# Patient Record
Sex: Female | Born: 1975 | Race: Black or African American | Hispanic: No | Marital: Married | Smoking: Former smoker
Health system: Southern US, Community
[De-identification: ages and names within clinical notes are randomized; demographics above are authoritative.]

## PROBLEM LIST (undated history)

## (undated) ENCOUNTER — Inpatient Hospital Stay: Payer: Self-pay

## (undated) DIAGNOSIS — O139 Gestational [pregnancy-induced] hypertension without significant proteinuria, unspecified trimester: Secondary | ICD-10-CM

## (undated) DIAGNOSIS — I10 Essential (primary) hypertension: Secondary | ICD-10-CM

## (undated) DIAGNOSIS — F1290 Cannabis use, unspecified, uncomplicated: Secondary | ICD-10-CM

## (undated) DIAGNOSIS — Z8759 Personal history of other complications of pregnancy, childbirth and the puerperium: Secondary | ICD-10-CM

## (undated) DIAGNOSIS — O09529 Supervision of elderly multigravida, unspecified trimester: Secondary | ICD-10-CM

## (undated) DIAGNOSIS — O24419 Gestational diabetes mellitus in pregnancy, unspecified control: Secondary | ICD-10-CM

## (undated) DIAGNOSIS — F10921 Alcohol use, unspecified with intoxication delirium: Secondary | ICD-10-CM

## (undated) DIAGNOSIS — Z8659 Personal history of other mental and behavioral disorders: Secondary | ICD-10-CM

## (undated) HISTORY — PX: OTHER SURGICAL HISTORY: SHX169

## (undated) HISTORY — DX: Gestational diabetes mellitus in pregnancy, unspecified control: O24.419

---
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
## 2009-02-06 ENCOUNTER — Emergency Department: Payer: Self-pay | Admitting: Emergency Medicine

## 2009-02-10 ENCOUNTER — Emergency Department: Payer: Self-pay | Admitting: Internal Medicine

## 2009-02-18 ENCOUNTER — Emergency Department: Payer: Self-pay | Admitting: Emergency Medicine

## 2009-04-24 ENCOUNTER — Emergency Department: Payer: Self-pay | Admitting: Emergency Medicine

## 2010-10-04 ENCOUNTER — Encounter: Payer: Self-pay | Admitting: Obstetrics and Gynecology

## 2010-10-25 ENCOUNTER — Encounter: Payer: Self-pay | Admitting: Maternal & Fetal Medicine

## 2010-11-08 ENCOUNTER — Encounter: Payer: Self-pay | Admitting: Maternal & Fetal Medicine

## 2010-11-22 ENCOUNTER — Encounter: Payer: Self-pay | Admitting: Obstetrics and Gynecology

## 2010-12-27 ENCOUNTER — Encounter: Payer: Self-pay | Admitting: Obstetrics and Gynecology

## 2011-01-22 ENCOUNTER — Encounter: Payer: Self-pay | Admitting: Pediatrics

## 2011-03-07 ENCOUNTER — Encounter: Payer: Self-pay | Admitting: Maternal & Fetal Medicine

## 2011-04-01 ENCOUNTER — Encounter: Payer: Self-pay | Admitting: Obstetrics and Gynecology

## 2011-04-29 ENCOUNTER — Encounter: Payer: Self-pay | Admitting: Obstetrics & Gynecology

## 2011-04-30 ENCOUNTER — Observation Stay: Payer: Self-pay

## 2011-05-01 ENCOUNTER — Observation Stay: Payer: Self-pay | Admitting: Obstetrics and Gynecology

## 2011-05-10 ENCOUNTER — Observation Stay: Payer: Self-pay | Admitting: Obstetrics and Gynecology

## 2011-05-21 ENCOUNTER — Observation Stay: Payer: Self-pay

## 2011-05-22 LAB — PIH PROFILE
Calcium, Total: 8.6 mg/dL (ref 8.5–10.1)
Chloride: 107 mmol/L (ref 98–107)
Co2: 21 mmol/L (ref 21–32)
EGFR (African American): >60
HCT: 32.4 % — ABNORMAL LOW (ref 35.0–47.0)
HGB: 10.7 g/dL — ABNORMAL LOW (ref 12.0–16.0)
MCH: 29.8 pg (ref 26.0–34.0)
MCHC: 32.9 g/dL (ref 32.0–36.0)
MCV: 91 fL (ref 80–100)
Osmolality: 281 (ref 275–301)
RBC: 3.58 10*6/uL — ABNORMAL LOW (ref 3.80–5.20)
RDW: 14.8 % — ABNORMAL HIGH (ref 11.5–14.5)
SGOT(AST): 17 U/L (ref 15–37)
Sodium: 142 mmol/L (ref 136–145)
Uric Acid: 3.7 mg/dL (ref 2.6–6.0)
WBC: 15.2 10*3/uL — ABNORMAL HIGH (ref 3.6–11.0)

## 2011-05-25 ENCOUNTER — Inpatient Hospital Stay: Payer: Self-pay | Admitting: Specialist

## 2011-05-25 LAB — CBC WITH DIFFERENTIAL/PLATELET
Basophil %: 0.4 %
Eosinophil #: 0.3 10*3/uL (ref 0.0–0.7)
Eosinophil %: 2 %
HCT: 34.6 % — ABNORMAL LOW (ref 35.0–47.0)
HGB: 11.5 g/dL — ABNORMAL LOW (ref 12.0–16.0)
Lymphocyte #: 2.6 10*3/uL (ref 1.0–3.6)
MCH: 30.2 pg (ref 26.0–34.0)
MCHC: 33.4 g/dL (ref 32.0–36.0)
MCV: 90 fL (ref 80–100)
Monocyte #: 1.2 10*3/uL — ABNORMAL HIGH (ref 0.0–0.7)
Monocyte %: 7.5 %
Neutrophil #: 11.8 10*3/uL — ABNORMAL HIGH (ref 1.4–6.5)
Neutrophil %: 73.7 %
RBC: 3.82 10*6/uL (ref 3.80–5.20)
WBC: 16 10*3/uL — ABNORMAL HIGH (ref 3.6–11.0)

## 2011-05-25 LAB — PROTEIN / CREATININE RATIO, URINE: Protein, Random Urine: 7 mg/dL (ref 0–12)

## 2011-05-27 LAB — HEMATOCRIT: HCT: 31.3 % — ABNORMAL LOW (ref 35.0–47.0)

## 2014-09-08 ENCOUNTER — Ambulatory Visit
Admit: 2014-09-08 | Disposition: A | Discharge status: Home / Self Care | Payer: Self-pay | Attending: Ophthalmology | Admitting: Ophthalmology

## 2014-09-08 LAB — POTASSIUM: Potassium: 3.4 mmol/L — ABNORMAL LOW

## 2014-09-13 ENCOUNTER — Ambulatory Visit
Admit: 2014-09-13 | Disposition: A | Discharge status: Home / Self Care | Payer: Self-pay | Attending: Ophthalmology | Admitting: Ophthalmology

## 2014-09-18 NOTE — Op Note (Signed)
PATIENT NAME:  Kelly Huber, [name]#:  [id number] DATE OF BIRTH:  10-Dec-1975  DATE OF PROCEDURE:  09/13/2014  SURGEON: Chrissie NoaWilliam L. Poet Hineman, M.D.   PROCEDURE IN DETAIL: The patient was examined and consented in the preoperative holding area. She was then brought back to the operating room where managed anesthesia care was applied. The patient's eye was also anesthetized using the standard compounded gel in the preoperative holding area.      The right eye was prepped and draped in the usual sterile ophthalmic fashion. A lid speculum was placed. A paracentesis was created. The viscoelastic was used to free beneath the pupil from the anterior capsule. The keratome was then used to create the main incision. A Malyugin ring was placed with minimal trauma to the iris. Vision Blue dye was then used to stain the anterior capsule as this was entirely white cataract. A capsulorrhexis was performed using a cystotome followed by capsulorrhexis forceps. Hydrodissection and hydrodelineation were carried out with BSS and a blunt cannula. The cataract was removed in a modified stop and chop technique. Once the cataract was removed in its entirety, the capsule was examined and found to be intact. The capsular bag was inflated with viscoelastic and the Tecnis ZCB00 17.5 diopter lens, serial #[id number] was placed within the capsular bag. The Malyugin ring was removed from the eye. The remaining viscoelastic was removed from the eye. The wounds were hydrated and found to not be watertight. A single 10-0 nylon stitch was placed in the main incision, and a secondary single nylon 10-0 nylon stitch was placed through the paracentesis. The eye was then inflated to physiologic pressure and all the wounds were found to be watertight. The anterior chamber was flushed with Vigamox.   The patient was brought to postoperative recovery in excellent condition. She will follow up with me in 1 day.   ____________________________ Jerilee FieldWilliam L.  Clyde Zarrella, MD wlp:JT D: 09/13/2014 20:57:58 ET T: 09/14/2014 08:57:33 ET JOB#: [id number]  cc: Jaelene Garciagarcia L. Roxanne Panek, MD, <Dictator> Jerilee FieldWILLIAM L Mallerie Blok MD ELECTRONICALLY SIGNED 09/14/2014 12:04

## 2015-05-21 NOTE — L&D Delivery Note (Signed)
Delivery Note At 7:18 PM a viable female infant was delivered precipitously via Vaginal, Spontaneous Delivery (Presentation: straight OA).  APGAR: pending; weight  pending.   Placenta status: spontaneous and intact.  Cord: 3VC  without omplications: .  Cord pH:N/A  Anesthesia:  Epidural  Episiotomy:  none Lacerations:  none Est. Blood Loss (mL): 200mL   Mom to postpartum.  Baby to Couplet care / Skin to Skin.  Ravenna Legore M 02/29/2016, 7:37 PM

## 2016-01-10 LAB — OB RESULTS CONSOLE VARICELLA ZOSTER ANTIBODY, IGG: Varicella: IMMUNE

## 2016-01-10 LAB — OB RESULTS CONSOLE HIV ANTIBODY (ROUTINE TESTING): HIV: NONREACTIVE

## 2016-01-10 LAB — OB RESULTS CONSOLE ANTIBODY SCREEN: Antibody Screen: NEGATIVE

## 2016-01-10 LAB — OB RESULTS CONSOLE RPR: RPR: NONREACTIVE

## 2016-01-10 LAB — OB RESULTS CONSOLE ABO/RH: RH TYPE: POSITIVE

## 2016-01-10 LAB — OB RESULTS CONSOLE HEPATITIS B SURFACE ANTIGEN: Hepatitis B Surface Ag: NEGATIVE

## 2016-01-10 LAB — OB RESULTS CONSOLE RUBELLA ANTIBODY, IGM: Rubella: IMMUNE

## 2016-01-16 ENCOUNTER — Observation Stay
Admission: EM | Admit: 2016-01-16 | Discharge: 2016-01-16 | Disposition: A | Discharge status: Home / Self Care | Payer: Medicaid Other | Attending: Certified Nurse Midwife | Admitting: Certified Nurse Midwife

## 2016-01-16 ENCOUNTER — Encounter: Payer: Self-pay | Admitting: Certified Nurse Midwife

## 2016-01-16 DIAGNOSIS — O4700 False labor before 37 completed weeks of gestation, unspecified trimester: Secondary | ICD-10-CM | POA: Diagnosis present

## 2016-01-16 DIAGNOSIS — Z3A00 Weeks of gestation of pregnancy not specified: Secondary | ICD-10-CM | POA: Insufficient documentation

## 2016-01-16 HISTORY — DX: Supervision of elderly multigravida, unspecified trimester: O09.529

## 2016-01-16 HISTORY — DX: Cannabis use, unspecified, uncomplicated: F12.90

## 2016-01-16 HISTORY — DX: Personal history of other mental and behavioral disorders: Z87.59

## 2016-01-16 HISTORY — DX: Personal history of other mental and behavioral disorders: Z86.59

## 2016-01-16 HISTORY — DX: Essential (primary) hypertension: I10

## 2016-01-16 LAB — URINALYSIS COMPLETE WITH MICROSCOPIC (ARMC ONLY)
Bilirubin Urine: NEGATIVE
Glucose, UA: NEGATIVE mg/dL
Hgb urine dipstick: NEGATIVE
Ketones, ur: NEGATIVE mg/dL
Nitrite: NEGATIVE
Protein, ur: NEGATIVE mg/dL
Specific Gravity, Urine: 1.008 (ref 1.005–1.030)
pH: 6 (ref 5.0–8.0)

## 2016-01-16 LAB — FETAL FIBRONECTIN: FETAL FIBRONECTIN: NEGATIVE

## 2016-01-16 NOTE — Final Progress Note (Signed)
Physician Final Progress Note  Patient ID: Kelly Huber MRN: [id number] DOB/AGE: 38/21/1977 37 y.o.  Admit date: 01/16/2016 Admitting provider: Vena Austria, MD Discharge date: 01/16/2016   Admission Diagnoses: Preterm contractions  Discharge Diagnoses: Discomforts of pregnancy, no evidence of contractions on monitoring, no cervical dilation, and FFN negative  Consults: None  Significant Findings/ Diagnostic Studies: Results for orders placed or performed during the hospital encounter of 01/16/16 (from the past 24 hour(s))  Fetal fibronectin     Status: Abnormal   Collection Time: 01/16/16 12:41 PM  Result Value Ref Range   Fetal Fibronectin NEGATIVE NEGATIVE   Appearance, FETFIB THICK (A) CLEAR  Urinalysis complete, with microscopic ([facility] only)     Status: Abnormal   Collection Time: 01/16/16 12:41 PM  Result Value Ref Range   Color, Urine YELLOW (A) YELLOW   APPearance HAZY (A) CLEAR   Glucose, UA NEGATIVE NEGATIVE mg/dL   Bilirubin Urine NEGATIVE NEGATIVE   Ketones, ur NEGATIVE NEGATIVE mg/dL   Specific Gravity, Urine 1.008 1.005 - 1.030   Hgb urine dipstick NEGATIVE NEGATIVE   pH 6.0 5.0 - 8.0   Protein, ur NEGATIVE NEGATIVE mg/dL   Nitrite NEGATIVE NEGATIVE   Leukocytes, UA TRACE (A) NEGATIVE   RBC / HPF 0-5 0 - 5 RBC/hpf   WBC, UA 0-5 0 - 5 WBC/hpf   Bacteria, UA RARE (A) NONE SEEN   Squamous Epithelial / LPF 6-30 (A) NONE SEEN     Procedures: NST 145, moderate variability, 10x10 accels, no decels, no contractions on tocometer  Discharge Condition: good  Disposition: Home  Diet: Regular diet  Discharge Activity: Activity as tolerated  Discharge Instructions    Discharge activity:  No Restrictions    Complete by:  As directed   No sexual activity restrictions    Complete by:  As directed   Notify physician for a general feeling that "something is not right"    Complete by:  As directed   Notify physician for increase or change in vaginal discharge     Complete by:  As directed   Notify physician for intestinal cramps, with or without diarrhea, sometimes described as "gas pain"    Complete by:  As directed   Notify physician for leaking of fluid    Complete by:  As directed   Notify physician for low, dull backache, unrelieved by heat or Tylenol    Complete by:  As directed   Notify physician for menstrual like cramps    Complete by:  As directed   Notify physician for pelvic pressure    Complete by:  As directed   Notify physician for uterine contractions.  These may be painless and feel like the uterus is tightening or the baby is  "balling up"    Complete by:  As directed   Notify physician for vaginal bleeding    Complete by:  As directed   PRETERM LABOR:  Includes any of the follwing symptoms that occur between 20 - [redacted] weeks gestation.  If these symptoms are not stopped, preterm labor can result in preterm delivery, placing your baby at risk    Complete by:  As directed       Medication List    TAKE these medications   labetalol 100 MG tablet Commonly known as:  NORMODYNE Take 100 mg by mouth 2 (two) times daily.   multivitamin-prenatal 27-0.8 MG Tabs tablet Take 1 tablet by mouth daily at 12 noon.   pantoprazole 40 MG tablet Commonly known  as:  PROTONIX Take 40 mg by mouth daily.   sertraline 50 MG tablet Commonly known as:  ZOLOFT Take 50 mg by mouth daily.        Total time spent taking care of this patient:20 minutes  Signed: Lorrene ReidSTAEBLER, Aloha Bartok M 01/16/2016, 2:33 PM

## 2016-01-16 NOTE — OB Triage Note (Signed)
Presents with complaints of contractions. [facility] and sent here for monitoring

## 2016-02-16 ENCOUNTER — Encounter: Payer: Medicaid Other | Attending: Obstetrics and Gynecology | Admitting: *Deleted

## 2016-02-16 ENCOUNTER — Encounter: Payer: Self-pay | Admitting: *Deleted

## 2016-02-16 VITALS — BP 122/70 | Ht 66.0 in | Wt 180.6 lb

## 2016-02-16 DIAGNOSIS — Z713 Dietary counseling and surveillance: Secondary | ICD-10-CM | POA: Diagnosis not present

## 2016-02-16 DIAGNOSIS — O99810 Abnormal glucose complicating pregnancy: Secondary | ICD-10-CM | POA: Diagnosis present

## 2016-02-16 DIAGNOSIS — O24410 Gestational diabetes mellitus in pregnancy, diet controlled: Secondary | ICD-10-CM

## 2016-02-16 NOTE — Patient Instructions (Signed)
Read booklet on Gestational Diabetes Follow Gestational Meal Planning Guidelines Avoid fruit juices Avoid cold cereal for breakfast Limit drinks with artificial sweeteners Complete a 3 Day Food Record and bring to next appointment Check blood sugars 4 x day - before breakfast and 2 hrs after every meal and record  Bring blood sugar log to all appointments Call MD for prescription for meter strips and lancets Strips  Accu-Chek Guide     Lancets  Accu-Chek FastClix Purchase urine ketone strips if blood sugars not controlled and check urine ketones every am:  If + increase bedtime snack to 1 protein and 2 carbohydrate servings Walk 20-30 minutes at least 5 x week if permitted by MD

## 2016-02-16 NOTE — Progress Notes (Signed)
Diabetes Self-Management Education  Visit Type: First/Initial  Appt. Start Time: 1320 Appt. End Time: 1505  02/16/2016  Kelly Huber, identified by name and date of birth, is a 40 y.o. female with a diagnosis of Diabetes: Gestational Diabetes.   ASSESSMENT  Blood pressure 122/70, height 5\' 6"  (1.676 m), weight 180 lb 9.6 oz (81.9 kg), last menstrual period 05/28/2015. Body mass index is 29.15 kg/m.      Diabetes Self-Management Education - 02/16/16 1525      Visit Information   Visit Type First/Initial     Initial Visit   Diabetes Type Gestational Diabetes   Are you currently following a meal plan? Yes   What type of meal plan do you follow? no sodas   Are you taking your medications as prescribed? Yes   Date Diagnosed Sept 2017     Health Coping   How would you rate your overall health? Good     Psychosocial Assessment   Patient Belief/Attitude about Diabetes Motivated to manage diabetes  "mad and sad"   Self-care barriers None   Self-management support Doctor's office;Family   Other persons present Spouse/SO   Patient Concerns Nutrition/Meal planning;Glycemic Control;Healthy Lifestyle;Monitoring   Special Needs None   Preferred Learning Style Auditory;Visual;Hands on   Learning Readiness Change in progress   How often do you need to have someone help you when you read instructions, pamphlets, or other written materials from your doctor or pharmacy? 1 - Never   What is the last grade level you completed in school? 1 1/2 yrs college     Pre-Education Assessment   Patient understands the diabetes disease and treatment process. Needs Instruction   Patient understands incorporating nutritional management into lifestyle. Needs Instruction   Patient undertands incorporating physical activity into lifestyle. Needs Instruction   Patient understands using medications safely. Needs Instruction   Patient understands monitoring blood glucose, interpreting and using  results Needs Instruction   Patient understands prevention, detection, and treatment of acute complications. Needs Instruction   Patient understands prevention, detection, and treatment of chronic complications. Needs Instruction   Patient understands how to develop strategies to address psychosocial issues. Needs Instruction   Patient understands how to develop strategies to promote health/change behavior. Needs Instruction     Complications   How often do you check your blood sugar? 0 times/day (not testing)  Provided Accu-Chek Guide meter and instructed on use. BG upon return demonstration was 86 mg/dL at 1:612:50 pm - 3 hrs pp.    Have you had a dilated eye exam in the past 12 months? Yes   Have you had a dental exam in the past 12 months? Yes   Are you checking your feet? Yes   How many days per week are you checking your feet? 7     Dietary Intake   Breakfast smoothie with juice, carrotts, fruit   Snack (afternoon) crackers, cheese, fruit   Dinner meat, veggies   Beverage(s) artificial sweetened beverages and water with lemon, coffee     Exercise   Exercise Type ADL's     Patient Education   Previous Diabetes Education No   Disease state  Definition of diabetes, type 1 and 2, and the diagnosis of diabetes   Nutrition management  Role of diet in the treatment of diabetes and the relationship between the three main macronutrients and blood glucose level   Physical activity and exercise  Role of exercise on diabetes management, blood pressure control and cardiac health.  Monitoring Taught/evaluated SMBG meter.;Purpose and frequency of SMBG.;Taught/discussed recording of test results and interpretation of SMBG.;Ketone testing, when, how.   Chronic complications Relationship between chronic complications and blood glucose control   Psychosocial adjustment Identified and addressed patients feelings and concerns about diabetes   Preconception care Pregnancy and GDM  Role of pre-pregnancy  blood glucose control on the development of the fetus;Reviewed with patient blood glucose goals with pregnancy;Role of family planning for patients with diabetes   Personal strategies to promote health Review risk of smoking and offered smoking cessation     Individualized Goals (developed by patient)   Reducing Risk Improve blood sugars Prevent diabetes complications Lead a healthier lifestyle Quit smoking     Outcomes   Expected Outcomes Demonstrated interest in learning. Expect positive outcomes      Individualized Plan for Diabetes Self-Management Training:   Learning Objective:  Patient will have a greater understanding of diabetes self-management. Patient education plan is to attend individual and/or group sessions per assessed needs and concerns.   Plan:   Patient Instructions  Read booklet on Gestational Diabetes Follow Gestational Meal Planning Guidelines Avoid fruit juices Avoid cold cereal for breakfast Limit drinks with artificial sweeteners Complete a 3 Day Food Record and bring to next appointment Check blood sugars 4 x day - before breakfast and 2 hrs after every meal and record  Bring blood sugar log to all appointments Call MD for prescription for meter strips and lancets Strips  Accu-Chek Guide     Lancets  Accu-Chek FastClix Purchase urine ketone strips if blood sugars not controlled and check urine ketones every am:  If + increase bedtime snack to 1 protein and 2 carbohydrate servings Walk 20-30 minutes at least 5 x week if permitted by MD   Expected Outcomes:  Demonstrated interest in learning. Expect positive outcomes  Education material provided: Gestational Booklet Gestational Meal Planning Guidelines Viewed Gestational Diabetes Video Meter = Accu-Chek Guide 3 Day Food Record Goals for a Healthy Pregnancy  If problems or questions, patient to contact team via:   Sharion Settler, RN, CCM, CDE [phone]  Future DSME appointment:  Tuesday  February 20, 2016 for appointment with dietitian

## 2016-02-20 ENCOUNTER — Encounter: Payer: Medicaid Other | Attending: Obstetrics and Gynecology | Admitting: Dietician

## 2016-02-20 ENCOUNTER — Encounter: Payer: Self-pay | Admitting: Dietician

## 2016-02-20 VITALS — BP 122/80 | Ht 66.0 in | Wt 181.6 lb

## 2016-02-20 DIAGNOSIS — O99810 Abnormal glucose complicating pregnancy: Secondary | ICD-10-CM | POA: Insufficient documentation

## 2016-02-20 DIAGNOSIS — Z713 Dietary counseling and surveillance: Secondary | ICD-10-CM | POA: Diagnosis not present

## 2016-02-20 DIAGNOSIS — O24410 Gestational diabetes mellitus in pregnancy, diet controlled: Secondary | ICD-10-CM

## 2016-02-20 NOTE — Progress Notes (Signed)
   Patient's BG record indicates BGs are all within goal ranges.  Patient's food diary indicates well balanced meals, some meals low in carbohydrates.  She reports researching for diabetes meal planning and ideas online. She plans to continue with healthy eating habits permanently.   Provided 1900kcal meal plan, and wrote individualized menus based on patient's food preferences.  Instructed patient on food safety, including avoidance of Listeriosis, and limiting mercury from fish.  Discussed importance of maintaining healthy lifestyle habits to reduce risk of Type 2 DM as well as Gestational DM with any future pregnancies.  Advised patient to use any remaining testing supplies to test some BGs after delivery, and to have BG tested ideally annually, as well as prior to attempting future pregnancies.

## 2016-02-20 NOTE — Patient Instructions (Signed)
Continue with current eating pattern, allow for 2-3 servings of carb foods with each meal.

## 2016-02-28 ENCOUNTER — Inpatient Hospital Stay
Admission: EM | Admit: 2016-02-28 | Discharge: 2016-03-02 | DRG: 774 | Disposition: A | Discharge status: Home / Self Care | Payer: Medicaid Other | Attending: Obstetrics and Gynecology | Admitting: Obstetrics and Gynecology

## 2016-02-28 ENCOUNTER — Encounter: Payer: Self-pay | Admitting: *Deleted

## 2016-02-28 DIAGNOSIS — O24420 Gestational diabetes mellitus in childbirth, diet controlled: Secondary | ICD-10-CM | POA: Diagnosis present

## 2016-02-28 DIAGNOSIS — D62 Acute posthemorrhagic anemia: Secondary | ICD-10-CM | POA: Diagnosis present

## 2016-02-28 DIAGNOSIS — Z885 Allergy status to narcotic agent status: Secondary | ICD-10-CM

## 2016-02-28 DIAGNOSIS — O139 Gestational [pregnancy-induced] hypertension without significant proteinuria, unspecified trimester: Secondary | ICD-10-CM | POA: Diagnosis present

## 2016-02-28 DIAGNOSIS — O9081 Anemia of the puerperium: Secondary | ICD-10-CM | POA: Diagnosis present

## 2016-02-28 DIAGNOSIS — Z880 Allergy status to penicillin: Secondary | ICD-10-CM

## 2016-02-28 DIAGNOSIS — O1002 Pre-existing essential hypertension complicating childbirth: Secondary | ICD-10-CM | POA: Diagnosis present

## 2016-02-28 DIAGNOSIS — Z3A36 36 weeks gestation of pregnancy: Secondary | ICD-10-CM

## 2016-02-28 DIAGNOSIS — O114 Pre-existing hypertension with pre-eclampsia, complicating childbirth: Principal | ICD-10-CM | POA: Diagnosis present

## 2016-02-28 LAB — URINE DRUG SCREEN, QUALITATIVE (ARMC ONLY)
AMPHETAMINES, UR SCREEN: NOT DETECTED
BENZODIAZEPINE, UR SCRN: NOT DETECTED
Barbiturates, Ur Screen: NOT DETECTED
Cannabinoid 50 Ng, Ur ~~LOC~~: NOT DETECTED
Cocaine Metabolite,Ur ~~LOC~~: NOT DETECTED
MDMA (ECSTASY) UR SCREEN: NOT DETECTED
METHADONE SCREEN, URINE: NOT DETECTED
OPIATE, UR SCREEN: NOT DETECTED
Phencyclidine (PCP) Ur S: NOT DETECTED
Tricyclic, Ur Screen: NOT DETECTED

## 2016-02-28 LAB — PROTEIN / CREATININE RATIO, URINE
CREATININE, URINE: 51 mg/dL
PROTEIN CREATININE RATIO: 0.29 mg/mg{creat} — AB (ref 0.00–0.15)
TOTAL PROTEIN, URINE: 15 mg/dL

## 2016-02-28 LAB — CBC
HEMATOCRIT: 34.4 % — AB (ref 35.0–47.0)
Hemoglobin: 11.8 g/dL — ABNORMAL LOW (ref 12.0–16.0)
MCH: 29.6 pg (ref 26.0–34.0)
MCHC: 34.3 g/dL (ref 32.0–36.0)
MCV: 86.2 fL (ref 80.0–100.0)
PLATELETS: 349 10*3/uL (ref 150–440)
RBC: 3.99 MIL/uL (ref 3.80–5.20)
RDW: 14.4 % (ref 11.5–14.5)
WBC: 15.1 10*3/uL — AB (ref 3.6–11.0)

## 2016-02-28 LAB — COMPREHENSIVE METABOLIC PANEL
ALBUMIN: 3.5 g/dL (ref 3.5–5.0)
ALT: 14 U/L (ref 14–54)
ANION GAP: 8 (ref 5–15)
AST: 21 U/L (ref 15–41)
Alkaline Phosphatase: 164 U/L — ABNORMAL HIGH (ref 38–126)
BILIRUBIN TOTAL: 0.3 mg/dL (ref 0.3–1.2)
BUN: 10 mg/dL (ref 6–20)
CHLORIDE: 108 mmol/L (ref 101–111)
CO2: 18 mmol/L — ABNORMAL LOW (ref 22–32)
Calcium: 9.1 mg/dL (ref 8.9–10.3)
Creatinine, Ser: 0.75 mg/dL (ref 0.44–1.00)
GFR calc Af Amer: >60 mL/min (ref >60)
GFR calc non Af Amer: >60 mL/min (ref >60)
GLUCOSE: 70 mg/dL (ref 65–99)
POTASSIUM: 3.6 mmol/L (ref 3.5–5.1)
SODIUM: 134 mmol/L — AB (ref 135–145)
TOTAL PROTEIN: 7.6 g/dL (ref 6.5–8.1)

## 2016-02-28 LAB — GLUCOSE, CAPILLARY: GLUCOSE-CAPILLARY: 73 mg/dL (ref 65–99)

## 2016-02-28 MED ORDER — ACETAMINOPHEN 325 MG PO TABS: 650.0000 mg | ORAL_TABLET | ORAL | Status: DC | PRN

## 2016-02-28 MED ORDER — TERBUTALINE SULFATE 1 MG/ML IJ SOLN
INTRAMUSCULAR | Status: AC
Start: 2016-02-28 — End: 2016-02-28
  Administered 2016-02-28: 0.25 mg via SUBCUTANEOUS
  Filled 2016-02-28: qty 1

## 2016-02-28 MED ORDER — BUTORPHANOL TARTRATE 1 MG/ML IJ SOLN
INTRAMUSCULAR | Status: AC
  Administered 2016-02-28: 2 mg via INTRAVENOUS
  Filled 2016-02-28: qty 2

## 2016-02-28 MED ORDER — LABETALOL HCL 200 MG PO TABS
300.0000 mg | ORAL_TABLET | Freq: Three times a day (TID) | ORAL | Status: DC
  Administered 2016-02-28 – 2016-03-02 (×9): 300 mg via ORAL
  Filled 2016-02-28 (×9): qty 1

## 2016-02-28 MED ORDER — SODIUM CHLORIDE FLUSH 0.9 % IV SOLN
INTRAVENOUS | Status: AC
  Administered 2016-02-28: 10 mL
  Filled 2016-02-28: qty 20

## 2016-02-28 MED ORDER — BUTORPHANOL TARTRATE 1 MG/ML IJ SOLN
2.0000 mg | Freq: Once | INTRAMUSCULAR | Status: AC
  Administered 2016-02-28: 2 mg via INTRAVENOUS

## 2016-02-28 MED ORDER — ONDANSETRON HCL 4 MG/2ML IJ SOLN
4.0000 mg | Freq: Four times a day (QID) | INTRAMUSCULAR | Status: DC | PRN
  Administered 2016-02-28: 4 mg via INTRAVENOUS
  Filled 2016-02-28: qty 2

## 2016-02-28 MED ORDER — MORPHINE SULFATE (PF) 4 MG/ML IV SOLN
4.0000 mg | INTRAVENOUS | Status: DC | PRN
  Administered 2016-02-29: 4 mg via INTRAVENOUS
  Filled 2016-02-28: qty 1

## 2016-02-28 MED ORDER — LABETALOL HCL 5 MG/ML IV SOLN
10.0000 mg | INTRAVENOUS | Status: DC | PRN
  Administered 2016-02-28 – 2016-02-29 (×8): 10 mg via INTRAVENOUS
  Filled 2016-02-28 (×4): qty 4

## 2016-02-28 MED ORDER — LACTATED RINGERS IV SOLN
INTRAVENOUS | Status: DC
  Administered 2016-02-28 (×2): via INTRAVENOUS

## 2016-02-28 MED ORDER — TERBUTALINE SULFATE 1 MG/ML IJ SOLN
0.2500 mg | Freq: Once | INTRAMUSCULAR | Status: AC
  Administered 2016-02-28: 0.25 mg via SUBCUTANEOUS

## 2016-02-28 NOTE — Progress Notes (Signed)
EFM being adjusted, pt sitting upright in bed eating sandwich provided. States she wants to hold off on the pain med for now since it sometimes make her feel nauseated. BP checked, remains elevated above pt's norm during this pregnancy, Labetalol 300mg  given po. Plan for shift reviewed with pt, s/s that she should report discussed, understanding verbalized.

## 2016-02-28 NOTE — Progress Notes (Signed)
  Labor Progress Note   40 y.o. [id number] @ 5319w3d , admitted for cHTN with acute worsening of BPs, and lower abdominal pain with ctxs every 5 minutes.  Subjective:  Still has pain.  Stadol briefly helped. Denies h/a, blurry vision, epigastric pain, CP, SOB, edema.  Objective:  BP (!) 147/94   Pulse 70   Temp 98.8 F (37.1 C) (Oral)   Resp 20   LMP 05/28/2015  Abd: moderate Extr: trace to 1+ bilateral pedal edema SVE: CERVIX: 1/60/-3, unchanged  EFM: FHR: 140 bpm, variability: minimal ,  accelerations:  Abscent,  decelerations:  Absent Toco: Frequency: Every 5 minutes Labs: I have reviewed the patient's lab results.  No signs of preclampsia.  Assessment & Plan:  [id number] @ 4819w3d, admitted for chronic HTN with acute worsening; abdominal pain.  Pt has GDM.  1. Pain management: Morphine this time to see if helps.. 2. FWB: FHT category 1.  3. ID: GBS not done 4. Labetalol for HTN.  Several IV doses have been given.  Will give po dose at higher dose than she has been on- 300 TID. 5. GDM, BS 73, monitor.  All discussed with patient, see orders

## 2016-02-28 NOTE — H&P (Signed)
Obstetrics Admission History & Physical   Hypertension   HPI:  40 y.o. [id number] @ 8131w3d (03/24/2016, by Ultrasound). Admitted on 02/28/2016:   Patient Active Problem List   Diagnosis Date Noted  . Hypertension affecting pregnancy in third trimester 02/28/2016  . Threatened preterm labor 01/16/2016     Presents for c/o pain in abdomen c/w ctxs since early this am.  Reports n/v followed by seeing spots.  No headache, blurry vision, CP, SOB, change in edema or weight, or epigatric pain.  Has been on Labetalol for cHTN at 100 mg BID, no dose today.  GDM also this preg, diet controlled.  No LOF or VB, just reg mild ctxs.  [facility]  PMHx:  Past Medical History:  Diagnosis Date  . Advanced maternal age in multigravida   . Chronic hypertension   . Gestational diabetes   . History of postpartum depression   . Marijuana use    PSHx:  Past Surgical History:  Procedure Laterality Date  . cataract surgery    . dilation and currette     Medications:  Prescriptions Prior to Admission  Medication Sig Dispense Refill Last Dose  . Iron-FA-B Cmp-C-Biot-Probiotic (FUSION PLUS) CAPS Take 1 capsule by mouth daily.   02/27/2016  . labetalol (NORMODYNE) 100 MG tablet Take 100 mg by mouth 2 (two) times daily.   02/27/2016  . pantoprazole (PROTONIX) 40 MG tablet Take 40 mg by mouth daily.   02/27/2016  . Prenatal Vit-Fe Fumarate-FA (MULTIVITAMIN-PRENATAL) 27-0.8 MG TABS tablet Take 1 tablet by mouth daily at 12 noon.   02/27/2016   Allergies: is allergic to codeine and penicillins. OBHx:  OB History  Gravida Para Term Preterm AB Living  7 5 5  0 1 5  SAB TAB Ectopic Multiple Live Births               # Outcome Date GA Lbr Len/2nd Weight Sex Delivery Anes PTL Lv  7 Current           6 AB           5 Term           4 Term           3 Term           2 Term           1 Term              AVW:[id number]:Negative/unremarkable except as detailed in HPI. Soc Hx: Never smoker, drugs or  alcholol.  Objective:   Vitals:   02/28/16 1639 02/28/16 1654  BP: (!) 167/105 (!) 170/107  Pulse: 78 74   General: Well nourished, well developed female in no acute distress.  Skin: Warm and dry.  Cardiovascular:Regular rate and rhythm. Respiratory: Clear to auscultation bilateral. Normal respiratory effort Abdomen: moderate T, gravid, Vtx Neuro/Psych: Normal mood and affect.   Pelvic exam: is not limited by body habitus EGBUS: within normal limits Vagina: within normal limits and with normal mucosa blood in the vault Cervix: FT per Dr Bonney AidStaebler Uterus: Spontaneous uterine activity  Adnexa: not evaluated  EFM:FHR: 140 bpm, variability: moderate,  accelerations:  Present,  decelerations:  Absent Toco: Frequency: Every 3-5 minutes   Perinatal info:  Blood type: O positive Rubella- Immune Varicella -Immune TDaP Given during third trimester of this pregnancy RPR NR / HIV Neg/ HBsAg Neg   Assessment & Plan:   40 y.o. [id number] @ 3131w3d, Admitted on 02/28/2016:Hypertension, she has  chronic on Labetolol but this is worse then any other time this pregnancy.  Will eval for preeclampsia.  Also eval for labor, monitor for ctx pattern and cervical change.  IV for fluids and for IV Labetolol due to recent sBP >170  Patient is counseled at length about the risks and consequences of Gestational Hypertension, especially as it pertains to the development of preclampsia.  Risks of preclampsia include maternal and fetal complications, usually necessitating active delivery planning.  Physical exam findings, fetal heart rate monitoring, and laboratory findings are utilized together to determine the presence and severity of any gestational hypertension disorder.  Further management will be based on these findings.  Continued bed rest and frequent follow-up for blood pressure checks and symptom evaluation is of utmost importance for the remainder of pregnancy.

## 2016-02-29 ENCOUNTER — Inpatient Hospital Stay: Payer: Medicaid Other | Admitting: Certified Registered Nurse Anesthetist

## 2016-02-29 DIAGNOSIS — O24420 Gestational diabetes mellitus in childbirth, diet controlled: Secondary | ICD-10-CM | POA: Diagnosis present

## 2016-02-29 DIAGNOSIS — O114 Pre-existing hypertension with pre-eclampsia, complicating childbirth: Secondary | ICD-10-CM | POA: Diagnosis present

## 2016-02-29 DIAGNOSIS — Z880 Allergy status to penicillin: Secondary | ICD-10-CM | POA: Diagnosis not present

## 2016-02-29 DIAGNOSIS — O1002 Pre-existing essential hypertension complicating childbirth: Secondary | ICD-10-CM | POA: Diagnosis present

## 2016-02-29 DIAGNOSIS — O9081 Anemia of the puerperium: Secondary | ICD-10-CM | POA: Diagnosis present

## 2016-02-29 DIAGNOSIS — Z885 Allergy status to narcotic agent status: Secondary | ICD-10-CM | POA: Diagnosis not present

## 2016-02-29 DIAGNOSIS — O139 Gestational [pregnancy-induced] hypertension without significant proteinuria, unspecified trimester: Secondary | ICD-10-CM | POA: Diagnosis present

## 2016-02-29 DIAGNOSIS — Z3A36 36 weeks gestation of pregnancy: Secondary | ICD-10-CM | POA: Diagnosis not present

## 2016-02-29 DIAGNOSIS — D62 Acute posthemorrhagic anemia: Secondary | ICD-10-CM | POA: Diagnosis present

## 2016-02-29 HISTORY — DX: Gestational (pregnancy-induced) hypertension without significant proteinuria, unspecified trimester: O13.9

## 2016-02-29 LAB — GLUCOSE, CAPILLARY
Glucose-Capillary: 142 mg/dL — ABNORMAL HIGH (ref 65–99)
Glucose-Capillary: 95 mg/dL (ref 65–99)
Glucose-Capillary: 116 mg/dL — ABNORMAL HIGH (ref 65–99)

## 2016-02-29 LAB — CBC
HEMATOCRIT: 31.2 % — AB (ref 35.0–47.0)
HEMATOCRIT: 31.8 % — AB (ref 35.0–47.0)
HEMOGLOBIN: 11.2 g/dL — AB (ref 12.0–16.0)
Hemoglobin: 11.3 g/dL — ABNORMAL LOW (ref 12.0–16.0)
MCH: 30.4 pg (ref 26.0–34.0)
MCH: 30.5 pg (ref 26.0–34.0)
MCHC: 35.6 g/dL (ref 32.0–36.0)
MCHC: 35.8 g/dL (ref 32.0–36.0)
MCV: 85.2 fL (ref 80.0–100.0)
MCV: 85.4 fL (ref 80.0–100.0)
PLATELETS: 322 10*3/uL (ref 150–440)
Platelets: 326 10*3/uL (ref 150–440)
RBC: 3.66 MIL/uL — AB (ref 3.80–5.20)
RBC: 3.72 MIL/uL — AB (ref 3.80–5.20)
RDW: 14.4 % (ref 11.5–14.5)
RDW: 14.4 % (ref 11.5–14.5)
WBC: 12.5 10*3/uL — AB (ref 3.6–11.0)
WBC: 15.7 10*3/uL — ABNORMAL HIGH (ref 3.6–11.0)

## 2016-02-29 LAB — COMPREHENSIVE METABOLIC PANEL
ALBUMIN: 2.9 g/dL — AB (ref 3.5–5.0)
ALT: 12 U/L — ABNORMAL LOW (ref 14–54)
AST: 21 U/L (ref 15–41)
Alkaline Phosphatase: 153 U/L — ABNORMAL HIGH (ref 38–126)
Anion gap: 7 (ref 5–15)
BILIRUBIN TOTAL: 0.4 mg/dL (ref 0.3–1.2)
BUN: 10 mg/dL (ref 6–20)
CHLORIDE: 108 mmol/L (ref 101–111)
CO2: 20 mmol/L — ABNORMAL LOW (ref 22–32)
Calcium: 8.7 mg/dL — ABNORMAL LOW (ref 8.9–10.3)
Creatinine, Ser: 0.77 mg/dL (ref 0.44–1.00)
GFR calc Af Amer: >60 mL/min (ref >60)
GFR calc non Af Amer: >60 mL/min (ref >60)
GLUCOSE: 79 mg/dL (ref 65–99)
POTASSIUM: 3.9 mmol/L (ref 3.5–5.1)
Sodium: 135 mmol/L (ref 135–145)
Total Protein: 6.1 g/dL — ABNORMAL LOW (ref 6.5–8.1)

## 2016-02-29 LAB — KLEIHAUER-BETKE STAIN
Fetal Cells %: 0 %
Quantitation Fetal Hemoglobin: 0 mL

## 2016-02-29 LAB — APTT: aPTT: 28 seconds (ref 24–36)

## 2016-02-29 LAB — PROTIME-INR
INR: 0.94
Prothrombin Time: 12.6 seconds (ref 11.4–15.2)

## 2016-02-29 LAB — TYPE AND SCREEN
ABO/RH(D): O POS
ANTIBODY SCREEN: NEGATIVE

## 2016-02-29 LAB — FIBRINOGEN: Fibrinogen: 575 mg/dL — ABNORMAL HIGH (ref 210–475)

## 2016-02-29 MED ORDER — HYDRALAZINE HCL 20 MG/ML IJ SOLN
10.0000 mg | INTRAMUSCULAR | Status: DC | PRN
  Administered 2016-02-29: 10 mg via INTRAVENOUS
  Filled 2016-02-29: qty 1

## 2016-02-29 MED ORDER — PRENATAL MULTIVITAMIN CH
1.0000 | ORAL_TABLET | Freq: Every day | ORAL | Status: DC
  Administered 2016-03-01 – 2016-03-02 (×2): 1 via ORAL
  Filled 2016-02-29 (×2): qty 1

## 2016-02-29 MED ORDER — BENZOCAINE-MENTHOL 20-0.5 % EX AERO: 1.0000 "application " | INHALATION_SPRAY | CUTANEOUS | Status: DC | PRN

## 2016-02-29 MED ORDER — CLINDAMYCIN PHOSPHATE 900 MG/50ML IV SOLN
900.0000 mg | Freq: Three times a day (TID) | INTRAVENOUS | Status: DC
  Administered 2016-02-29: 900 mg via INTRAVENOUS
  Filled 2016-02-29: qty 50

## 2016-02-29 MED ORDER — OXYTOCIN 40 UNITS IN LACTATED RINGERS INFUSION - SIMPLE MED: 2.5000 [IU]/h | INTRAVENOUS | Status: DC

## 2016-02-29 MED ORDER — DIPHENHYDRAMINE HCL 25 MG PO CAPS: 25.0000 mg | ORAL_CAPSULE | Freq: Four times a day (QID) | ORAL | Status: DC | PRN

## 2016-02-29 MED ORDER — FENTANYL 2.5 MCG/ML W/ROPIVACAINE 0.2% IN NS 100 ML EPIDURAL INFUSION (ARMC-ANES)
EPIDURAL | Status: AC
  Filled 2016-02-29: qty 100

## 2016-02-29 MED ORDER — BUPIVACAINE HCL (PF) 0.25 % IJ SOLN
INTRAMUSCULAR | Status: DC | PRN
  Administered 2016-02-29 (×2): 4 mL via EPIDURAL

## 2016-02-29 MED ORDER — ONDANSETRON HCL 4 MG PO TABS
4.0000 mg | ORAL_TABLET | ORAL | Status: DC | PRN
Start: 2016-02-29 — End: 2016-03-02

## 2016-02-29 MED ORDER — SIMETHICONE 80 MG PO CHEW: 80.0000 mg | CHEWABLE_TABLET | ORAL | Status: DC | PRN

## 2016-02-29 MED ORDER — TERBUTALINE SULFATE 1 MG/ML IJ SOLN: 0.2500 mg | Freq: Once | INTRAMUSCULAR | Status: DC | PRN

## 2016-02-29 MED ORDER — OXYTOCIN 10 UNIT/ML IJ SOLN
INTRAMUSCULAR | Status: AC
  Filled 2016-02-29: qty 2

## 2016-02-29 MED ORDER — HYDRALAZINE HCL 20 MG/ML IJ SOLN
5.0000 mg | Freq: Once | INTRAMUSCULAR | Status: AC | PRN
  Administered 2016-02-29: 5 mg via INTRAVENOUS
  Filled 2016-02-29: qty 1

## 2016-02-29 MED ORDER — LIDOCAINE-EPINEPHRINE (PF) 1.5 %-1:200000 IJ SOLN
INTRAMUSCULAR | Status: DC | PRN
  Administered 2016-02-29: 3 mL via EPIDURAL

## 2016-02-29 MED ORDER — LACTATED RINGERS IV SOLN
500.0000 mL | INTRAVENOUS | Status: DC | PRN
  Administered 2016-02-29: 1000 mL via INTRAVENOUS

## 2016-02-29 MED ORDER — ONDANSETRON HCL 4 MG/2ML IJ SOLN: 4.0000 mg | INTRAMUSCULAR | Status: DC | PRN

## 2016-02-29 MED ORDER — OXYTOCIN 40 UNITS IN LACTATED RINGERS INFUSION - SIMPLE MED
1.0000 m[IU]/min | INTRAVENOUS | Status: DC
  Administered 2016-02-29 (×2): 1 m[IU]/min via INTRAVENOUS
  Filled 2016-02-29: qty 1000

## 2016-02-29 MED ORDER — IBUPROFEN 600 MG PO TABS
600.0000 mg | ORAL_TABLET | Freq: Four times a day (QID) | ORAL | Status: DC
Start: 2016-03-01 — End: 2016-03-01
  Administered 2016-02-29 – 2016-03-01 (×4): 600 mg via ORAL
  Filled 2016-02-29 (×4): qty 1

## 2016-02-29 MED ORDER — FENTANYL 2.5 MCG/ML W/ROPIVACAINE 0.2% IN NS 100 ML EPIDURAL INFUSION (ARMC-ANES)
EPIDURAL | Status: DC | PRN
  Administered 2016-02-29: 10 mL/h via EPIDURAL

## 2016-02-29 MED ORDER — DIBUCAINE 1 % RE OINT: 1.0000 "application " | TOPICAL_OINTMENT | RECTAL | Status: DC | PRN

## 2016-02-29 MED ORDER — WITCH HAZEL-GLYCERIN EX PADS: 1.0000 "application " | MEDICATED_PAD | CUTANEOUS | Status: DC | PRN

## 2016-02-29 MED ORDER — ACETAMINOPHEN 325 MG PO TABS: 650.0000 mg | ORAL_TABLET | ORAL | Status: DC | PRN

## 2016-02-29 MED ORDER — SENNOSIDES-DOCUSATE SODIUM 8.6-50 MG PO TABS
2.0000 | ORAL_TABLET | ORAL | Status: DC
  Administered 2016-03-01 – 2016-03-02 (×2): 2 via ORAL
  Filled 2016-02-29 (×2): qty 2

## 2016-02-29 MED ORDER — AMMONIA AROMATIC IN INHA
RESPIRATORY_TRACT | Status: AC
  Filled 2016-02-29: qty 10

## 2016-02-29 MED ORDER — LIDOCAINE HCL (PF) 1 % IJ SOLN
INTRAMUSCULAR | Status: DC | PRN
  Administered 2016-02-29: 3 mL via SUBCUTANEOUS

## 2016-02-29 MED ORDER — MISOPROSTOL 200 MCG PO TABS
ORAL_TABLET | ORAL | Status: AC
  Filled 2016-02-29: qty 4

## 2016-02-29 MED ORDER — OXYTOCIN BOLUS FROM INFUSION
500.0000 mL | Freq: Once | INTRAVENOUS | Status: AC
  Administered 2016-02-29: 500 mL via INTRAVENOUS

## 2016-02-29 MED ORDER — LIDOCAINE HCL (PF) 1 % IJ SOLN
INTRAMUSCULAR | Status: AC
  Filled 2016-02-29: qty 30

## 2016-02-29 MED ORDER — OXYCODONE-ACETAMINOPHEN 5-325 MG PO TABS
1.0000 | ORAL_TABLET | ORAL | Status: DC | PRN
  Administered 2016-02-29 – 2016-03-02 (×8): 1 via ORAL
  Filled 2016-02-29 (×7): qty 1

## 2016-02-29 MED ORDER — OXYCODONE-ACETAMINOPHEN 5-325 MG PO TABS
2.0000 | ORAL_TABLET | ORAL | Status: DC | PRN
  Administered 2016-03-01 – 2016-03-02 (×3): 2 via ORAL
  Filled 2016-02-29 (×4): qty 2

## 2016-02-29 MED ORDER — SODIUM CHLORIDE 0.9 % IJ SOLN
INTRAMUSCULAR | Status: AC
  Administered 2016-02-29: 09:00:00
  Filled 2016-02-29: qty 50

## 2016-02-29 MED ORDER — COCONUT OIL OIL: 1.0000 "application " | TOPICAL_OIL | Status: DC | PRN

## 2016-02-29 MED ORDER — BUTORPHANOL TARTRATE 1 MG/ML IJ SOLN
1.0000 mg | INTRAMUSCULAR | Status: DC | PRN
  Administered 2016-02-29 (×2): 1 mg via INTRAVENOUS
  Filled 2016-02-29 (×2): qty 1

## 2016-02-29 MED ORDER — PANTOPRAZOLE SODIUM 40 MG PO TBEC
40.0000 mg | DELAYED_RELEASE_TABLET | Freq: Every day | ORAL | Status: DC
  Administered 2016-02-29: 40 mg via ORAL
  Filled 2016-02-29: qty 1

## 2016-02-29 MED ORDER — VANCOMYCIN HCL IN DEXTROSE 1-5 GM/200ML-% IV SOLN
1000.0000 mg | Freq: Two times a day (BID) | INTRAVENOUS | Status: DC
Start: 2016-02-29 — End: 2016-02-29
  Administered 2016-02-29: 1000 mg via INTRAVENOUS
  Filled 2016-02-29 (×3): qty 200

## 2016-02-29 MED ORDER — LACTATED RINGERS IV SOLN
INTRAVENOUS | Status: DC
  Administered 2016-02-29: 08:00:00 via INTRAVENOUS

## 2016-02-29 NOTE — Progress Notes (Signed)
Spoke with Dr Harris aboutTiburcio Huber recent decel, FHR down to 75-80's bpm x 3 mins, gradually recovering back to baseline 135-140's with minimal variability. Pt resting quietly, remains lying on Lt side tilt and c/o feeling pain in lower abdomen, slightly improved since Morphine 4mg  given.

## 2016-02-29 NOTE — Progress Notes (Signed)
Pt states her "leg gave out a little on me but I didn't fall, I caught myself on the bed rail". Pt instructed to call for assistance every time she is up out of bed until she has full movement back. States " Oh, I'm gonna call for the next 24 hours for help. My husband can help me and if he ain't here I'll call the nurse."

## 2016-02-29 NOTE — Progress Notes (Signed)
Subjective:  Comfortable with epidural in place  Objective:   Vitals:   02/29/16 1508 02/29/16 1600 02/29/16 1605 02/29/16 1610  BP: (!) 164/96     Pulse: 66     Resp:      Temp:      TempSrc:      SpO2:  100% 99% 99%  Weight:      Height:        Cervical Exam:  Dilation: 4 Effacement (%): 50 Cervical Position: Posterior Station: -2 Presentation: Vertex Exam by:: Siara Gorder AROM clear  FHT: 130, moderate, +accels, no decels Toco: q2-753min  Results for orders placed or performed during the hospital encounter of 02/28/16 (from the past 24 hour(s))  Protein / creatinine ratio, urine     Status: Abnormal   Collection Time: 02/28/16  4:27 PM  Result Value Ref Range   Creatinine, Urine 51 mg/dL   Total Protein, Urine 15 mg/dL   Protein Creatinine Ratio 0.29 (H) 0.00 - 0.15 mg/mg[Cre]  Urine Drug Screen, Qualitative ([facility] only)     Status: None   Collection Time: 02/28/16  4:27 PM  Result Value Ref Range   Tricyclic, Ur Screen NONE DETECTED NONE DETECTED   Amphetamines, Ur Screen NONE DETECTED NONE DETECTED   MDMA (Ecstasy)Ur Screen NONE DETECTED NONE DETECTED   Cocaine Metabolite,Ur [place] NONE DETECTED NONE DETECTED   Opiate, Ur Screen NONE DETECTED NONE DETECTED   Phencyclidine (PCP) Ur S NONE DETECTED NONE DETECTED   Cannabinoid 50 Ng, Ur [place] NONE DETECTED NONE DETECTED   Barbiturates, Ur Screen NONE DETECTED NONE DETECTED   Benzodiazepine, Ur Scrn NONE DETECTED NONE DETECTED   Methadone Scn, Ur NONE DETECTED NONE DETECTED  Glucose, capillary     Status: None   Collection Time: 02/28/16  4:53 PM  Result Value Ref Range   Glucose-Capillary 73 65 - 99 mg/dL  CBC     Status: Abnormal   Collection Time: 02/28/16  5:06 PM  Result Value Ref Range   WBC 15.1 (H) 3.6 - 11.0 K/uL   RBC 3.99 3.80 - 5.20 MIL/uL   Hemoglobin 11.8 (L) 12.0 - 16.0 g/dL   HCT 91.434.4 (L) 78.235.0 - 95.647.0 %   MCV 86.2 80.0 - 100.0 fL   MCH 29.6 26.0 - 34.0 pg   MCHC 34.3 32.0 - 36.0 g/dL   RDW 21.314.4 08.611.5 -  57.814.5 %   Platelets 349 150 - 440 K/uL  Comprehensive metabolic panel     Status: Abnormal   Collection Time: 02/28/16  5:06 PM  Result Value Ref Range   Sodium 134 (L) 135 - 145 mmol/L   Potassium 3.6 3.5 - 5.1 mmol/L   Chloride 108 101 - 111 mmol/L   CO2 18 (L) 22 - 32 mmol/L   Glucose, Bld 70 65 - 99 mg/dL   BUN 10 6 - 20 mg/dL   Creatinine, Ser 4.690.75 0.44 - 1.00 mg/dL   Calcium 9.1 8.9 - 62.910.3 mg/dL   Total Protein 7.6 6.5 - 8.1 g/dL   Albumin 3.5 3.5 - 5.0 g/dL   AST 21 15 - 41 U/L   ALT 14 14 - 54 U/L   Alkaline Phosphatase 164 (H) 38 - 126 U/L   Total Bilirubin 0.3 0.3 - 1.2 mg/dL   GFR calc non Af Amer >60 >60 mL/min   GFR calc Af Amer >60 >60 mL/min   Anion gap 8 5 - 15  Type and screen [facility]     Status: None  Collection Time: 02/28/16  5:06 PM  Result Value Ref Range   ABO/RH(D) O POS    Antibody Screen NEG    Sample Expiration 03/02/2016   CBC     Status: Abnormal   Collection Time: 02/28/16 11:48 PM  Result Value Ref Range   WBC 15.7 (H) 3.6 - 11.0 K/uL   RBC 3.66 (L) 3.80 - 5.20 MIL/uL   Hemoglobin 11.2 (L) 12.0 - 16.0 g/dL   HCT 60.4 (L) 54.0 - 98.1 %   MCV 85.2 80.0 - 100.0 fL   MCH 30.5 26.0 - 34.0 pg   MCHC 35.8 32.0 - 36.0 g/dL   RDW 19.1 47.8 - 29.5 %   Platelets 322 150 - 440 K/uL  APTT     Status: None   Collection Time: 02/28/16 11:48 PM  Result Value Ref Range   aPTT 28 24 - 36 seconds  Fibrinogen     Status: Abnormal   Collection Time: 02/28/16 11:48 PM  Result Value Ref Range   Fibrinogen 575 (H) 210 - 475 mg/dL  Protime-INR     Status: None   Collection Time: 02/28/16 11:48 PM  Result Value Ref Range   Prothrombin Time 12.6 11.4 - 15.2 seconds   INR 0.94   Kleihauer-Betke stain     Status: None   Collection Time: 02/29/16  7:48 AM  Result Value Ref Range   Fetal Cells % 0 %   Quantitation Fetal Hemoglobin 0.0000 mL   # Vials RhIg NOT INDICATED   CBC     Status: Abnormal   Collection Time: 02/29/16  7:48 AM   Result Value Ref Range   WBC 12.5 (H) 3.6 - 11.0 K/uL   RBC 3.72 (L) 3.80 - 5.20 MIL/uL   Hemoglobin 11.3 (L) 12.0 - 16.0 g/dL   HCT 62.1 (L) 30.8 - 65.7 %   MCV 85.4 80.0 - 100.0 fL   MCH 30.4 26.0 - 34.0 pg   MCHC 35.6 32.0 - 36.0 g/dL   RDW 84.6 96.2 - 95.2 %   Platelets 326 150 - 440 K/uL  Comprehensive metabolic panel     Status: Abnormal   Collection Time: 02/29/16  7:48 AM  Result Value Ref Range   Sodium 135 135 - 145 mmol/L   Potassium 3.9 3.5 - 5.1 mmol/L   Chloride 108 101 - 111 mmol/L   CO2 20 (L) 22 - 32 mmol/L   Glucose, Bld 79 65 - 99 mg/dL   BUN 10 6 - 20 mg/dL   Creatinine, Ser 8.41 0.44 - 1.00 mg/dL   Calcium 8.7 (L) 8.9 - 10.3 mg/dL   Total Protein 6.1 (L) 6.5 - 8.1 g/dL   Albumin 2.9 (L) 3.5 - 5.0 g/dL   AST 21 15 - 41 U/L   ALT 12 (L) 14 - 54 U/L   Alkaline Phosphatase 153 (H) 38 - 126 U/L   Total Bilirubin 0.4 0.3 - 1.2 mg/dL   GFR calc non Af Amer >60 >60 mL/min   GFR calc Af Amer >60 >60 mL/min   Anion gap 7 5 - 15  Glucose, capillary     Status: Abnormal   Collection Time: 02/29/16 10:38 AM  Result Value Ref Range   Glucose-Capillary 142 (H) 65 - 99 mg/dL  Glucose, capillary     Status: None   Collection Time: 02/29/16  3:04 PM  Result Value Ref Range   Glucose-Capillary 95 65 - 99 mg/dL    Assessment:   40 y.o. [id number]  [redacted]w[redacted]d IOL for superimposed preeclampsia based on severe range blood pressures refractory to interventions  Plan:   1) Labor - AROM clear, pitocin halfed  2) Fetus - cat I tracing

## 2016-02-29 NOTE — Anesthesia Preprocedure Evaluation (Addendum)
Anesthesia Evaluation  Patient identified by MRN, date of birth, ID band Patient awake    Reviewed: Allergy & Precautions, NPO status , Patient's Chart, lab work & pertinent test results  Airway Mallampati: II  TM Distance: >3 FB Neck ROM: Full    Dental   Pulmonary Current Smoker,           Cardiovascular hypertension, Pt. on medications      Neuro/Psych    GI/Hepatic GERD  ,  Endo/Other  diabetes  Renal/GU      Musculoskeletal   Abdominal   Peds  Hematology   Anesthesia Other Findings   Reproductive/Obstetrics (+) Pregnancy                            Anesthesia Physical Anesthesia Plan  ASA: II  Anesthesia Plan: Epidural   Post-op Pain Management:    Induction:   Airway Management Planned:   Additional Equipment:   Intra-op Plan:   Post-operative Plan:   Informed Consent: I have reviewed the patients History and Physical, chart, labs and discussed the procedure including the risks, benefits and alternatives for the proposed anesthesia with the patient or authorized representative who has indicated his/her understanding and acceptance.     Plan Discussed with: CRNA and Anesthesiologist  Anesthesia Plan Comments:         Anesthesia Quick Evaluation

## 2016-02-29 NOTE — Progress Notes (Signed)
  Labor Progress Note   40 y.o. Kelly Huber @ 3980w4d , admitted for cHTN with acute worsening of BPs, and lower abdominal pain with ctxs  Subjective:  Still has pain. Pain is lower abdomen and also feels like ctxs are regular. Denies h/a, blurry vision, epigastric pain, CP, SOB, edema.  Objective:  BP (!) 158/84 (BP Location: Left Arm)   Pulse 64   Temp 98.8 F (37.1 C) (Oral)   Resp 18   LMP 05/28/2015   SpO2 100%  Abd: moderate Extr: trace to 1+ bilateral pedal edema SVE: CERVIX: 1/60/-3, unchanged  EFM: FHR: 140 bpm, variability: minimal ,  accelerations:  yes,  decelerations:  One decel for 3 min to 80 bpm w recovery Toco: Frequency: Every 5 minutes Labs: I have reviewed the patient's lab results.  No signs of preclampsia.   Assessment & Plan:  Kelly Huber @ 2880w4d, admitted for chronic HTN with acute worsening; abdominal pain.  Pt has GDM. Due to concerns over less controllable HTN, pain, and now deceleration, will proceed with induction possible CS. Pt counseled as to risks of this decision making and the risks to the pregnancy right now. Hypertension risks, fetal well being, and prematurity discussed.   1. Pain management: IV medicine has been used.  Pt wants epidural later. 2. FWB: FHT category 1.  3. ID: GBS not done.  Will treat with ABX. 4. Labetalol for HTN.  Several IV doses have been given.  PO dose started and seemed to help briefly. 5. GDM, monitor  All discussed with patient, see orders

## 2016-02-29 NOTE — Progress Notes (Signed)
Pt escorted downstairs via w/c by FOB. Pt instructed that she should not leave the floor and also instructed on hospital smoking policy. Pt states she's going downstairs and will not get out of the wheelchair. Security saw pt get into a van/SUV to smoke in the parking lot as told to me by the nursing supervisor. Pt instructed yet again when she returned to her assigned room of 337 that she shouldn't leave the floor.

## 2016-02-29 NOTE — Anesthesia Procedure Notes (Signed)
Epidural Patient location during procedure: OB Start time: 02/29/2016 2:33 PM End time: 02/29/2016 3:00 PM  Staffing Anesthesiologist: Naomie DeanKEPHART, WILLIAM K Resident/CRNA: Malva CoganBEANE, Shunda Rabadi Performed: resident/CRNA   Preanesthetic Checklist Completed: patient identified, site marked, surgical consent, pre-op evaluation, timeout performed, IV checked, risks and benefits discussed and monitors and equipment checked  Epidural Patient position: sitting Prep: Betadine Patient monitoring: heart rate, continuous pulse ox and blood pressure Approach: midline Location: L4-L5 Injection technique: LOR saline  Needle:  Needle type: Tuohy  Needle gauge: 17 G Needle length: 9 cm and 9 Needle insertion depth: 6 cm Catheter type: closed end flexible Catheter size: 19 Gauge Catheter at skin depth: 12 cm Test dose: negative and 1.5% lidocaine with Epi 1:200 K  Assessment Sensory level: T10 Events: blood not aspirated, injection not painful, no injection resistance, negative IV test and no paresthesia  Additional Notes Pt. Evaluated and documentation done after procedure finished. Patient identified. Risks/Benefits/Options discussed with patient including but not limited to bleeding, infection, nerve damage, paralysis, failed block, incomplete pain control, headache, blood pressure changes, nausea, vomiting, reactions to medication both or allergic, itching and postpartum back pain. Confirmed with bedside nurse the patient's most recent platelet count. Confirmed with patient that they are not currently taking any anticoagulation, have any bleeding history or any family history of bleeding disorders. Patient expressed understanding and wished to proceed. All questions were answered. Sterile technique was used throughout the entire procedure. Please see nursing notes for vital signs. Test dose was given through epidural catheter and negative prior to continuing to dose epidural or start infusion. Warning  signs of high block given to the patient including shortness of breath, tingling/numbness in hands, complete motor block, or any concerning symptoms with instructions to call for help. Patient was given instructions on fall risk and not to get out of bed. All questions and concerns addressed with instructions to call with any issues or inadequate analgesia.   Patient tolerated the insertion well without immediate complications.Reason for block:procedure for pain

## 2016-02-29 NOTE — Discharge Summary (Signed)
Obstetric Discharge Summary Reason for Admission: Acute worsening and unresponsive chronic hypertension  Prenatal Procedures: none Intrapartum Procedures: spontaneous vaginal delivery Postpartum Procedures: none Complications-Operative and Postpartum: none Hemoglobin  Date Value Ref Range Status  03/01/2016 11.7 (L) 12.0 - 16.0 g/dL Final   HGB  Date Value Ref Range Status  05/25/2011 11.5 (L) 12.0 - 16.0 g/dL Final   HCT  Date Value Ref Range Status  03/01/2016 32.7 (L) 35.0 - 47.0 % Final  05/27/2011 31.3 (L) 35.0 - 47.0 % Final    Physical Exam:  General: alert, appears stated age and no distress Lochia: appropriate Uterine Fundus: firm DVT Evaluation: No evidence of DVT seen on physical exam.  Discharge Diagnoses: Term Pregnancy-delivered  Discharge Information: Date: 03/02/2016 Activity: pelvic rest Diet: routine Medications: PNV, Ibuprofen, Percocet and Labetalol 300 mg Three times a day Condition: stable Discharge to: home   Follow-up Information    Kelly Huber, Kelly Huber Follow up in 1 week(s).   Specialty:  Obstetrics and Gynecology Why:  BP check Contact information: [address] [place] [address] [phone]          Kelly LibraRobert Paul Daveyon Huber 03/02/2016, 10:54 AM

## 2016-02-29 NOTE — Progress Notes (Addendum)
Subjective:  Still feeling painful contractions, feels fine in between contractions.  No fevers, no chills.  Objective:   Vitals: Blood pressure (!) 157/98, pulse 65, temperature 98.1 F (36.7 C), temperature source Oral, resp. rate 16, height 5\' 6"  (1.676 m), weight 184 lb (83.5 kg), last menstrual period 05/28/2015, SpO2 99 %. General: NAD Abdomen: gravid non-tender Cervical Exam:  Dilation: 1 Effacement (%): 60 Cervical Position: Posterior Station: -3 Presentation: Vertex Exam by:: Manual MeierN. Aten, RN Foley bulb placed  Ultrasound confirmed VTX  FHT: 130, moderate, +accels, no decels Toco: still irregular  Results for orders placed or performed during the hospital encounter of 02/28/16 (from the past 24 hour(s))  Protein / creatinine ratio, urine     Status: Abnormal   Collection Time: 02/28/16  4:27 PM  Result Value Ref Range   Creatinine, Urine 51 mg/dL   Total Protein, Urine 15 mg/dL   Protein Creatinine Ratio 0.29 (H) 0.00 - 0.15 mg/mg[Cre]  Urine Drug Screen, Qualitative ([facility] only)     Status: None   Collection Time: 02/28/16  4:27 PM  Result Value Ref Range   Tricyclic, Ur Screen NONE DETECTED NONE DETECTED   Amphetamines, Ur Screen NONE DETECTED NONE DETECTED   MDMA (Ecstasy)Ur Screen NONE DETECTED NONE DETECTED   Cocaine Metabolite,Ur [place] NONE DETECTED NONE DETECTED   Opiate, Ur Screen NONE DETECTED NONE DETECTED   Phencyclidine (PCP) Ur S NONE DETECTED NONE DETECTED   Cannabinoid 50 Ng, Ur [place] NONE DETECTED NONE DETECTED   Barbiturates, Ur Screen NONE DETECTED NONE DETECTED   Benzodiazepine, Ur Scrn NONE DETECTED NONE DETECTED   Methadone Scn, Ur NONE DETECTED NONE DETECTED  Glucose, capillary     Status: None   Collection Time: 02/28/16  4:53 PM  Result Value Ref Range   Glucose-Capillary 73 65 - 99 mg/dL  CBC     Status: Abnormal   Collection Time: 02/28/16  5:06 PM  Result Value Ref Range   WBC 15.1 (H) 3.6 - 11.0 K/uL   RBC 3.99 3.80 - 5.20 MIL/uL   Hemoglobin 11.8 (L) 12.0 - 16.0 g/dL   HCT 16.134.4 (L) 09.635.0 - 04.547.0 %   MCV 86.2 80.0 - 100.0 fL   MCH 29.6 26.0 - 34.0 pg   MCHC 34.3 32.0 - 36.0 g/dL   RDW 40.914.4 81.111.5 - 91.414.5 %   Platelets 349 150 - 440 K/uL  Comprehensive metabolic panel     Status: Abnormal   Collection Time: 02/28/16  5:06 PM  Result Value Ref Range   Sodium 134 (L) 135 - 145 mmol/L   Potassium 3.6 3.5 - 5.1 mmol/L   Chloride 108 101 - 111 mmol/L   CO2 18 (L) 22 - 32 mmol/L   Glucose, Bld 70 65 - 99 mg/dL   BUN 10 6 - 20 mg/dL   Creatinine, Ser 7.820.75 0.44 - 1.00 mg/dL   Calcium 9.1 8.9 - 95.610.3 mg/dL   Total Protein 7.6 6.5 - 8.1 g/dL   Albumin 3.5 3.5 - 5.0 g/dL   AST 21 15 - 41 U/L   ALT 14 14 - 54 U/L   Alkaline Phosphatase 164 (H) 38 - 126 U/L   Total Bilirubin 0.3 0.3 - 1.2 mg/dL   GFR calc non Af Amer >60 >60 mL/min   GFR calc Af Amer >60 >60 mL/min   Anion gap 8 5 - 15  Type and screen [facility]     Status: None   Collection Time: 02/28/16  5:06  PM  Result Value Ref Range   ABO/RH(D) O POS    Antibody Screen NEG    Sample Expiration 03/02/2016   CBC     Status: Abnormal   Collection Time: 02/28/16 11:48 PM  Result Value Ref Range   WBC 15.7 (H) 3.6 - 11.0 K/uL   RBC 3.66 (L) 3.80 - 5.20 MIL/uL   Hemoglobin 11.2 (L) 12.0 - 16.0 g/dL   HCT 45.4 (L) 09.8 - 11.9 %   MCV 85.2 80.0 - 100.0 fL   MCH 30.5 26.0 - 34.0 pg   MCHC 35.8 32.0 - 36.0 g/dL   RDW 14.7 82.9 - 56.2 %   Platelets 322 150 - 440 K/uL  APTT     Status: None   Collection Time: 02/28/16 11:48 PM  Result Value Ref Range   aPTT 28 24 - 36 seconds  Fibrinogen     Status: Abnormal   Collection Time: 02/28/16 11:48 PM  Result Value Ref Range   Fibrinogen 575 (H) 210 - 475 mg/dL  Protime-INR     Status: None   Collection Time: 02/28/16 11:48 PM  Result Value Ref Range   Prothrombin Time 12.6 11.4 - 15.2 seconds   INR 0.94     Assessment:   40 y.o. [id number] [redacted]w[redacted]d with acute worsening of chronic hypertension  concern for possible superimposed pre-eclampsia  Plan:   1) Labor - continue to titrate pitocin, foley bulb placed  2) Fetus - category I tracing - I did offer the patient BMZ administration explain potential benefits, with only real downside elevation of patient's BG readings which we are monitoring as is given history of GDM (ACOG Practice Advisory "Antental Corticosteroid Administration in the Late Preterm Period" August 22, 2014).  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  Currently on clindamycin.  Given risk of clindamycin resistance will switch patient to vancomycin (2010 CDC "Guidlines for the Prevention of Perinatal Group B Streptococcal Disease" risk based screening approach for GBS unknown).  4) Abdominal pain - was contracting and did correlate with contraction on admission.  No fundal tenderness, no fevers, no fetal tachycardia.  WBC of 15K likely leukocytosis of pregnancy low concern for chorio.  Also given that pain is absent in between contraction low concern for something like appendicitis.  Given elevated BP I will check a KB in addition to fibrinogen and coags that have been sent yesterday to rule out potential abruption.  5) Acutely worsening HTN difficult to control - currently on 300mg  po labetalol bid (was 100mg  throughout pregnancy), has received several doses of IV labetalol.  Will switch to IV hydralazine for acute BP elevations for different mode of action. - P/C ratio 290 yesterday - remainder of labs stable repeat this AM  6) GDM diet controlled - q4hr BG check in latent labor q2hrs in active

## 2016-03-01 LAB — CBC
HEMATOCRIT: 32.7 % — AB (ref 35.0–47.0)
HEMOGLOBIN: 11.7 g/dL — AB (ref 12.0–16.0)
MCH: 30.7 pg (ref 26.0–34.0)
MCHC: 35.7 g/dL (ref 32.0–36.0)
MCV: 85.9 fL (ref 80.0–100.0)
Platelets: 350 10*3/uL (ref 150–440)
RBC: 3.81 MIL/uL (ref 3.80–5.20)
RDW: 14.6 % — ABNORMAL HIGH (ref 11.5–14.5)
WBC: 17.3 10*3/uL — ABNORMAL HIGH (ref 3.6–11.0)

## 2016-03-01 LAB — RPR: RPR Ser Ql: NONREACTIVE

## 2016-03-01 MED ORDER — IBUPROFEN 600 MG PO TABS
600.0000 mg | ORAL_TABLET | Freq: Four times a day (QID) | ORAL | Status: DC
Start: 2016-03-02 — End: 2016-03-02
  Administered 2016-03-02 (×3): 600 mg via ORAL
  Filled 2016-03-01 (×3): qty 1

## 2016-03-01 NOTE — Anesthesia Postprocedure Evaluation (Signed)
Anesthesia Post Note  Patient: Dominga FerryShaunette Till  Procedure(s) Performed: * No procedures listed *  Patient location during evaluation: Mother Baby Anesthesia Type: Epidural Level of consciousness: awake, awake and alert and oriented Pain management: pain level controlled Vital Signs Assessment: post-procedure vital signs reviewed and stable Respiratory status: spontaneous breathing and nonlabored ventilation Cardiovascular status: stable Postop Assessment: no headache, patient able to bend at knees and no backache Anesthetic complications: no    Last Vitals:  Vitals:   03/01/16 0038 03/01/16 0300  BP: 140/71 (!) 147/71  Pulse: 69 67  Resp: 20 18  Temp: 36.4 C 36.9 C    Last Pain:  Vitals:   03/01/16 0630  TempSrc:   PainSc: 4                  Zachary GeorgeWeatherly,  Beaulah Romanek F

## 2016-03-01 NOTE — Progress Notes (Signed)
Patient ID: Kelly Huber, female   DOB: 01/03/1976, 40 y.o.   MRN: [id number]  Obstetric Postpartum Daily Progress Note Subjective:  40 y.o. [id number] postpartum day #1 status post vaginal delivery.  She is ambulating, is tolerating po, is voiding spontaneously.  Her pain is well controlled on PO pain medications. Her lochia is less than menses. Denies current HA, visual disturbances, and RUQ pain.   Medications SCHEDULED MEDICATIONS  . ibuprofen  600 mg Oral Q6H  . labetalol  300 mg Oral TID  . prenatal multivitamin  1 tablet Oral Q1200  . senna-docusate  2 tablet Oral Q24H    MEDICATION INFUSIONS     PRN MEDICATIONS  acetaminophen, benzocaine-Menthol, coconut oil, witch hazel-glycerin **AND** dibucaine, diphenhydrAMINE, hydrALAZINE, ondansetron **OR** ondansetron (ZOFRAN) IV, oxyCODONE-acetaminophen, oxyCODONE-acetaminophen, simethicone    Objective:   Vitals:   02/29/16 2236 03/01/16 0038 03/01/16 0300 03/01/16 0813  BP:  140/71 (!) 147/71 (!) 144/76  Pulse: 71 69 67 62  Resp: 18 20 18 18   Temp: 98.2 F (36.8 C) 97.6 F (36.4 C) 98.4 F (36.9 C) 98.4 F (36.9 C)  TempSrc: Oral Oral Oral Oral  SpO2: 100%   100%  Weight:      Height:        Current Vital Signs 24h Vital Sign Ranges  T 98.4 F (36.9 C) Temp  Avg: 98 F (36.7 C)  Min: 97.6 F (36.4 C)  Max: 98.4 F (36.9 C)  BP (!) 144/76 (nurse Megan Aldrich notified) BP  Min: 120/76  Max: 176/106  HR 62 Pulse  Avg: 64.9  Min: 57  Max: 77  RR 18 Resp  Avg: 18  Min: 16  Max: 20  SaO2 100 % Not Delivered SpO2  Avg: 99.7 %  Min: 96 %  Max: 100 %       24 Hour I/O Current Shift I/O  Time Ins Outs 10/12 0701 - 10/13 0700 In: 780 [P.O.:480; I.V.:300] Out: 625 [Urine:625] No intake/output data recorded.  General: NAD Pulmonary: no increased work of breathing Abdomen: non-distended, non-tender, fundus firm at level of umbilicus Extremities: no edema, no erythema, no tenderness  Labs:   Recent Labs Lab 02/28/16 2348  02/29/16 0748 03/01/16 0519  WBC 15.7* 12.5* 17.3*  HGB 11.2* 11.3* 11.7*  HCT 31.2* 31.8* 32.7*  PLT 322 326 350     Assessment:   40 y.o. [id number] postpartum day # 1 status post SVD, superimposed preeclampsia.    Plan:   1) Acute blood loss anemia - hemodynamically stable and asymptomatic - po ferrous sulfate  2) O POS / Rubella Immune (08/23 0000)/ Varicella Immune  3) TDAP status up to date  4) breast /Contraception = undecided   5) superimposed preeclampsia. Patient did not get magnesium treatment and appears to be stable at this point without severe features.  Her blood pressures are now much better controlled on PO labetalol.  Continue to monitor.  6) Disposition: Home PPD#2 or 3, pending blood pressure management.  7) infant in SCN doing well  Thomasene MohairStephen Daveah Varone, MD 03/01/2016 10:03 AM

## 2016-03-02 MED ORDER — IBUPROFEN 600 MG PO TABS: 600.0000 mg | ORAL_TABLET | Freq: Four times a day (QID) | ORAL | 0 refills | Status: DC | PRN

## 2016-03-02 MED ORDER — OXYCODONE-ACETAMINOPHEN 5-325 MG PO TABS: 1.0000 | ORAL_TABLET | ORAL | 0 refills | Status: DC | PRN

## 2016-03-02 MED ORDER — NORETHINDRONE 0.35 MG PO TABS: 1.0000 | ORAL_TABLET | Freq: Every day | ORAL | 11 refills | Status: DC

## 2016-03-02 MED ORDER — LABETALOL HCL 300 MG PO TABS: 300.0000 mg | ORAL_TABLET | Freq: Three times a day (TID) | ORAL | 3 refills | Status: DC

## 2016-03-02 NOTE — Progress Notes (Signed)
Patient ID: Dominga FerryShaunette Paquette, female   DOB: 07/21/1975, 40 y.o.   MRN: [id number]  Obstetric Postpartum Daily Progress Note Subjective:  40 y.o. [id number] postpartum day #2 status post vaginal delivery.  She is ambulating, is tolerating po, is voiding spontaneously.  Her pain is well controlled on PO pain medications. Her lochia is less than menses. Denies current HA, visual disturbances, and RUQ pain.   Medications SCHEDULED MEDICATIONS  . ibuprofen  600 mg Oral Q6H  . labetalol  300 mg Oral TID  . prenatal multivitamin  1 tablet Oral Q1200  . senna-docusate  2 tablet Oral Q24H    MEDICATION INFUSIONS     PRN MEDICATIONS  acetaminophen, benzocaine-Menthol, coconut oil, witch hazel-glycerin **AND** dibucaine, diphenhydrAMINE, ondansetron **OR** [DISCONTINUED] ondansetron (ZOFRAN) IV, oxyCODONE-acetaminophen, oxyCODONE-acetaminophen, simethicone    Objective:   Vitals:   03/01/16 1941 03/01/16 2317 03/02/16 0545 03/02/16 0746  BP: 136/84 133/78 (!) 152/81 (!) 144/93  Pulse: (!) 103 75 62 66  Resp: 20 18 18 18   Temp: 99 F (37.2 C) 97.6 F (36.4 C) 98.7 F (37.1 C) 98.1 F (36.7 C)  TempSrc: Oral Oral Oral Oral  SpO2: 99%   100%  Weight:      Height:        Current Vital Signs 24h Vital Sign Ranges  T 98.1 F (36.7 C) Temp  Avg: 98.4 F (36.9 C)  Min: 97.6 F (36.4 C)  Max: 99 F (37.2 C)  BP (!) 144/93 BP  Min: 128/75  Max: 152/81  HR 66 Pulse  Avg: 76  Min: 62  Max: 103  RR 18 Resp  Avg: 18.7  Min: 18  Max: 20  SaO2 100 % Not Delivered SpO2  Avg: 99.5 %  Min: 99 %  Max: 100 %       24 Hour I/O Current Shift I/O  Time Ins Outs No intake/output data recorded. No intake/output data recorded.  General: NAD Pulmonary: no increased work of breathing Abdomen: non-distended, non-tender, fundus firm at level of umbilicus Extremities: no edema, no erythema, no tenderness  Labs:   Recent Labs Lab 02/28/16 2348 02/29/16 0748 03/01/16 0519  WBC 15.7* 12.5* 17.3*  HGB  11.2* 11.3* 11.7*  HCT 31.2* 31.8* 32.7*  PLT 322 326 350     Assessment:   40 y.o. [id number] postpartum day # 2 status post SVD, superimposed preeclampsia.    Plan:   1) Acute blood loss anemia - hemodynamically stable and asymptomatic  2) O POS / Rubella Immune (08/23 0000)/ Varicella Immune  3) TDAP status up to date  4) breast /Contraception = progesterone onloy pill  5) superimposed preeclampsia.   Her blood pressures are now much better controlled on PO labetalol.  Continue to monitor.  6) Disposition: Home PPD#2   7) infant idoing well  Annamarie MajorPaul Ladaisha Portillo, MD 03/02/2016 11:15 AM

## 2016-03-02 NOTE — Discharge Instructions (Signed)

## 2016-03-02 NOTE — Progress Notes (Signed)
Patient discharged home with infant. Vital signs stable, bleeding within normal limits, uterus firm. Discharge instructions, prescriptions, and follow up appointment given to and reviewed with patient. Patient verbalized understanding, all questions answered. Escorted in wheelchair by nursing. Levi Crass, RN     

## 2016-05-08 ENCOUNTER — Ambulatory Visit: Payer: Medicaid Other | Attending: Obstetrics and Gynecology | Admitting: Physical Therapy

## 2016-05-08 DIAGNOSIS — M791 Myalgia, unspecified site: Secondary | ICD-10-CM

## 2016-05-08 DIAGNOSIS — R262 Difficulty in walking, not elsewhere classified: Secondary | ICD-10-CM | POA: Insufficient documentation

## 2016-05-08 DIAGNOSIS — R2689 Other abnormalities of gait and mobility: Secondary | ICD-10-CM

## 2016-05-08 DIAGNOSIS — M5442 Lumbago with sciatica, left side: Secondary | ICD-10-CM | POA: Diagnosis present

## 2016-05-08 DIAGNOSIS — G8929 Other chronic pain: Secondary | ICD-10-CM | POA: Diagnosis present

## 2016-05-08 NOTE — Patient Instructions (Signed)
Log rolling technique:   use towel under L thigh to slide heel closer to get into bridging position  Then scoot to the L  with both feet and knees grounded   R arm overhead,  Log roll to the side   ______________ Turning positions in bed  Bear arms and shoulders into the bed, exhale as you move the leg   ______________  Stretches: for the back to decrease muscle spasms:  Hug self, elbows overhead down , hand by the side  3 x with breathing  Angel wings 3x   Knee to chest  With towel under L thigh , in hale, exhale bring the knee closer towards the chest     __________________  Strengthening the leg:  Towel under thighs,   heel slides with knee bend    __________________ Managing muscle spasms to minimize locking up and decreasing falls  Maintain stretches daily  Heating pad (no sleeping with it on) 20 min on muscles Breathing long smooth breaths while doing activities and when relaxing

## 2016-05-08 NOTE — Patient Instructions (Addendum)
Log rolling technique:   use towel under L thigh to slide heel closer to get into bridging position  Then scoot to the L  with both feet and knees grounded   R arm overhead,  Log roll to the side   ______________ Turning positions in bed  Bear arms and shoulders into the bed, exhale as you move the leg   ______________  Stretches: for the back to decrease muscle spasms:  Hug self, elbows overhead down , hand by the side  3 x with breathing  Angel wings 3x   Knee to chest  With towel under L thigh , in hale, exhale bring the knee closer towards the chest     __________________  Strengthening the leg:  Towel under thighs,   heel slides with knee bend    __________________ Managing muscle spasms to minimize locking up and decreasing falls  Maintain stretches daily  Heating pad (no sleeping with it on) 20 min on muscles Breathing long smooth breaths while doing activities and when relaxing    

## 2016-05-09 NOTE — Therapy (Addendum)
[facility] [address] [place], [place], [address] Phone: [phone]   Fax:  [phone]  Physical Therapy Evaluation  Patient Details  Name: Kelly FerryShaunette Huber MRN: [id number] Date of Birth: 02/16/1976 Referring Provider: Dr. Jerelyn ScottStraebler Primary Care: Sandrea HughsJessica Rubio, NP [facility] [phone]  Encounter Date: 05/08/2016      PT End of Session - 05/14/16 0843    Visit Number 1   Number of Visits 1   Date for PT Re-Evaluation 05/09/16   Authorization Type Medicaid coverage is limited to 1 visit    PT Start Time 1110   PT Stop Time 1230   PT Time Calculation (min) 80 min   Activity Tolerance Patient tolerated treatment well;Patient limited by pain      Past Medical History:  Diagnosis Date  . Advanced maternal age in multigravida   . Chronic hypertension   . Gestational diabetes   . History of postpartum depression   . Marijuana use     Past Surgical History:  Procedure Laterality Date  . cataract surgery    . dilation and currette      There were no vitals filed for this visit.       Subjective Assessment - 05/14/16 0810    Subjective Pt reports she has had L leg weakness and numbness since her epidural during L & D of her 6 th child 2 months ago. The pain increased with more back spasms. The pain now at its worst 9//10 and the numbness can travel from her toes upward or down from LBP.  This pain and numbness has caused her back and knee to lock up or "buckle", leading to 2-3 falls per week . Falling occurs most going up/ down the steps. Pt uses her baby's stroller to lean on, a shower chair to prevent from slipping in the shower. Pt has her husband and her older children to assist her. Pt was told by her spine MD to complete one week of PT and to visit her referred doc.  Pt has been waiting to see her MD to get an MRI. Pt is looking for an orthopedic doctor. Pt feels confused about who to see.   Pt relies on her husband and children to also help her to get dressed. 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     Limitations House hold activities;Walking;Standing;Lifting;Other (comment)  transfers             [facility] - 05/14/16 0810      Assessment   Medical Diagnosis LBP L LE weakness    Referring Provider Dr. Jerelyn ScottStraebler     Precautions   Precautions None     Restrictions   Weight Bearing Restrictions No     Balance Screen   Has the patient fallen in the past 6 months Yes  2-3x / week      Home Environment   Living Environment Private residence     Prior Function   Level of Independence Independent with community mobility with device     Observation/Other Assessments   Observations leans on stroller, baby sleeping in stroller throughout session.      Sensation   Light Touch --  decreased sensation to dull/light touch:L1,L2, L3 dermatome     Other:   Other/ Comments leans on R LE with sit to stand  Posture/Postural Control   Posture Comments sufficient postural stability with leg movements in supine but poor stability in standing due to inability to weight bear on LLE      Strength   Right Hip Flexion 4-/5   Right Hip External Rotation  4-/5   Right Hip Internal Rotation 4-/5   Right Hip ABduction 4-/5   Left Hip External Rotation 2/5   Left Hip Internal Rotation 2/5   Left Hip ABduction 3-/5   Right Knee Flexion 4/5   Right Knee Extension 4/5   Left Knee Flexion 4-/5   Left Knee Extension 4-/5   Right Ankle Dorsiflexion 4/5   Right Ankle Plantar Flexion 4/5   Right Ankle Inversion 4/5   Right Ankle Eversion 4/5   Left Ankle Dorsiflexion 2-/5   Left Ankle Plantar Flexion 2-/5   Left Ankle Inversion 2-/5   Left Ankle Eversion 2-/5     Palpation   Spinal mobility  mm tensions/severe tenderness at L2-3 of L paraspinals to T10 , reported referred pain  upward to thoracic with palpation at L 2-3    SI assessment  tenderness/tensions noted along dorsal ligament L, L PSIS more posterior, palpation at L PSIS caused radiating pain . Numbness present throughout all tests on LLE       Bed Mobility   Bed Mobility --  pain w/ rolling. less pain w/ cues      Transfers   Sit to Stand 6: Modified independent (Device/Increase time)     Ambulation/Gait   Assistive device Other (Comment)  leans on stroller   Gait Pattern Left circumduction;Abducted - left;Poor foot clearance - left  hyperextended knee of L                  Pelvic Floor Special Questions - 05/14/16 0815    Diastasis Recti neg           [facility] Adult PT Treatment/Exercise - 05/14/16 0810      Therapeutic Activites    Therapeutic Activities --  bed mobility techniques for less pain, AAROM heel slides     Moist Heat Therapy   Number Minutes Moist Heat 10 Minutes   Moist Heat Location Lumbar Spine  skin intact post Tx     Manual Therapy   Manual therapy comments long axis distraction LLE, STM along areas of increased tensions, rotational mob to corect pelvic obliquity on L PSIS                 PT Education - 05/14/16 0842    Education provided Yes   Education Details POC, anatomy, physiology, HEP, safe transfers, bed mobility   Person(s) Educated Patient   Methods Explanation;Demonstration;Tactile cues;Verbal cues;Handout   Comprehension Returned demonstration;Verbalized understanding          PT Short Term Goals - 05/14/16 0901      PT SHORT TERM GOAL #1   Title Pt will demo improved body mechanics with bed mobility without breathholding and report of less pain in order to perform ADLs.   Time 1   Period Days   Status Achieved     PT SHORT TERM GOAL #2   Title Pt will demo IND with HEP   Time 1   Period Days   Status Achieved     PT SHORT TERM GOAL #3   Title Pt will demo decreased mm tensions of her back and use of breathing techniques  in order to decrease guarding responses to minimize falls.  Time 1   Period Days   Status Achieved                  Plan - 05/14/16 0844    Clinical Impression Statement Pt is a [age] yr female who presents with extreme LLE weakness following the L& D of her 6th child 2 months ago. Pt reports losing sensation and movement of her LLE after her epidural shot. This deficit has contributed her frequent falls (2-3x/ week), dependence on her family for assistance out of chair or bed to standing. PT assessment showed pt's weakness is due to a neurological deficit with secondary issue of extreme mm spasms and pelvic obliquities due to pain response and gait deviations. During assessment, pt showed inability to put weight on her LLE and used hyperextension of her LLE and BUE on stroller for stability during gait. Pt also showed extreme weakness and decreased sensation along L1.2,3 dermatomes on LLE compared to RLE.  In addition, pt showed extreme guarding of back muscles with associated mm spasms from L2-3 upward towards thoracic mm along with L sided pelvic obliquities and tenderness along L dorsal ligament.  Following today's session, numbness, pain, and gait deviations were not changed but pt reported she learned new ways to mobilize in bed without freezing up with mm spasms.  Pt learned breathing techniques and use of towel to assist with LLE in addition to leg slides to assist with hip flexion in hooklying position as her  HEP.  Post-partum MSK assessment showed no DRA. Pelvic floor assessment was deferred. Body mechanics with carrying and lifting baby was not addressed due to time.  Medicaid coverage for PT is limited to one visit but pt will continue to need more support.  PT is referring pt to Dr. Yves Dillhasnis (Physiarist [phone]) for further diagnosis of pt's neurological presentations. Pt reports she has not had any imaging yet. PT also referred pt  to [facility] due to pt's  limited financial resources.   Her PCP: Sandrea HughsJessica Rubio, NP [facility] [phone].    Her referring provider: Joesphine BareAndreas Straebler. MD (OB-GYN) [phone]    Her PCP: Sandrea HughsJessica Rubio, NP [facility] [phone].    Her referring provider: Joesphine BareAndreas Straebler. MD (OB-GYN) [phone]    Rehab Potential Fair   PT Frequency One time visit   PT Treatment/Interventions ADLs/Self Care Home Management;Patient/family education;Neuromuscular re-education;Gait training;Therapeutic activities;Therapeutic exercise;Functional mobility training;Moist Heat;Manual techniques   Consulted and Agree with Plan of Care Patient      Patient will benefit from skilled therapeutic intervention in order to improve the following deficits and impairments:  Increased muscle spasms, Difficulty walking, Decreased safety awareness, Decreased endurance, Decreased activity tolerance, Pain, Decreased balance, Decreased strength, Decreased mobility, Improper body mechanics, Postural dysfunction, Decreased coordination, Decreased range of motion  Visit Diagnosis: Chronic left-sided low back pain with left-sided sciatica  Myalgia  Other abnormalities of gait and mobility  Difficulty in walking, not elsewhere classified     Problem List Patient Active Problem List   Diagnosis Date Noted  . Gestational hypertension 02/29/2016  . Hypertension affecting pregnancy in third trimester 02/28/2016  . Threatened preterm labor 01/16/2016    Mariane MastersYeung,Shin Yiing ,PT, DPT, E-RYT  05/14/2016, 9:21 AM  [facility] [address] [place], [place], [address] Phone: [phone]   Fax:  [phone]  Name: Kelly FerryShaunette Hang MRN: [id number] Date of Birth: 07/25/1975

## 2016-05-14 NOTE — Addendum Note (Signed)
Addended by: Mariane MastersYEUNG, SHIN-YIING on: 05/14/2016 09:32 AM   Modules accepted: Orders

## 2016-05-28 ENCOUNTER — Other Ambulatory Visit: Payer: Self-pay | Admitting: Orthopedic Surgery

## 2016-05-28 DIAGNOSIS — M5416 Radiculopathy, lumbar region: Secondary | ICD-10-CM

## 2016-06-04 ENCOUNTER — Ambulatory Visit
Admission: RE | Admit: 2016-06-04 | Discharge: 2016-06-04 | Disposition: A | Discharge status: Home / Self Care | Payer: Medicaid Other | Source: Ambulatory Visit | Attending: Orthopedic Surgery | Admitting: Orthopedic Surgery

## 2016-06-04 ENCOUNTER — Encounter: Payer: Self-pay | Admitting: Radiology

## 2016-06-04 DIAGNOSIS — M5416 Radiculopathy, lumbar region: Secondary | ICD-10-CM

## 2016-06-04 DIAGNOSIS — M48061 Spinal stenosis, lumbar region without neurogenic claudication: Secondary | ICD-10-CM | POA: Diagnosis not present

## 2016-06-07 ENCOUNTER — Ambulatory Visit: Payer: Medicaid Other

## 2016-06-12 DIAGNOSIS — M5416 Radiculopathy, lumbar region: Secondary | ICD-10-CM | POA: Insufficient documentation

## 2016-12-28 ENCOUNTER — Emergency Department: Payer: Medicaid Other

## 2016-12-28 ENCOUNTER — Observation Stay
Admission: EM | Admit: 2016-12-28 | Discharge: 2016-12-28 | Disposition: A | Discharge status: Home / Self Care | Payer: Medicaid Other | Attending: Internal Medicine | Admitting: Internal Medicine

## 2016-12-28 DIAGNOSIS — I10 Essential (primary) hypertension: Secondary | ICD-10-CM | POA: Diagnosis present

## 2016-12-28 DIAGNOSIS — Z880 Allergy status to penicillin: Secondary | ICD-10-CM | POA: Diagnosis not present

## 2016-12-28 DIAGNOSIS — F17210 Nicotine dependence, cigarettes, uncomplicated: Secondary | ICD-10-CM | POA: Insufficient documentation

## 2016-12-28 DIAGNOSIS — F1210 Cannabis abuse, uncomplicated: Secondary | ICD-10-CM | POA: Diagnosis present

## 2016-12-28 DIAGNOSIS — F10921 Alcohol use, unspecified with intoxication delirium: Secondary | ICD-10-CM

## 2016-12-28 DIAGNOSIS — R4182 Altered mental status, unspecified: Principal | ICD-10-CM | POA: Insufficient documentation

## 2016-12-28 DIAGNOSIS — Z79899 Other long term (current) drug therapy: Secondary | ICD-10-CM | POA: Insufficient documentation

## 2016-12-28 DIAGNOSIS — F10121 Alcohol abuse with intoxication delirium: Secondary | ICD-10-CM | POA: Diagnosis present

## 2016-12-28 DIAGNOSIS — R410 Disorientation, unspecified: Secondary | ICD-10-CM

## 2016-12-28 HISTORY — DX: Alcohol use, unspecified with intoxication delirium: F10.921

## 2016-12-28 LAB — URINE DRUG SCREEN, QUALITATIVE (ARMC ONLY)
Amphetamines, Ur Screen: NOT DETECTED
BENZODIAZEPINE, UR SCRN: NOT DETECTED
Barbiturates, Ur Screen: NOT DETECTED
COCAINE METABOLITE, UR ~~LOC~~: NOT DETECTED
Cannabinoid 50 Ng, Ur ~~LOC~~: POSITIVE — AB
MDMA (Ecstasy)Ur Screen: NOT DETECTED
METHADONE SCREEN, URINE: NOT DETECTED
OPIATE, UR SCREEN: NOT DETECTED
Phencyclidine (PCP) Ur S: NOT DETECTED
Tricyclic, Ur Screen: NOT DETECTED

## 2016-12-28 LAB — URINALYSIS, COMPLETE (UACMP) WITH MICROSCOPIC
Bilirubin Urine: NEGATIVE
Glucose, UA: NEGATIVE mg/dL
Ketones, ur: NEGATIVE mg/dL
Leukocytes, UA: NEGATIVE
Nitrite: NEGATIVE
PH: 5 (ref 5.0–8.0)
Protein, ur: NEGATIVE mg/dL
SPECIFIC GRAVITY, URINE: 1.004 — AB (ref 1.005–1.030)

## 2016-12-28 LAB — COMPREHENSIVE METABOLIC PANEL
ALK PHOS: 87 U/L (ref 38–126)
ALT: 20 U/L (ref 14–54)
ANION GAP: 9 (ref 5–15)
AST: 25 U/L (ref 15–41)
Albumin: 4.6 g/dL (ref 3.5–5.0)
BILIRUBIN TOTAL: 0.5 mg/dL (ref 0.3–1.2)
BUN: 9 mg/dL (ref 6–20)
CALCIUM: 9.3 mg/dL (ref 8.9–10.3)
CO2: 21 mmol/L — ABNORMAL LOW (ref 22–32)
Chloride: 112 mmol/L — ABNORMAL HIGH (ref 101–111)
Creatinine, Ser: 0.88 mg/dL (ref 0.44–1.00)
GFR calc non Af Amer: >60 mL/min (ref >60)
Glucose, Bld: 108 mg/dL — ABNORMAL HIGH (ref 65–99)
Potassium: 3.3 mmol/L — ABNORMAL LOW (ref 3.5–5.1)
SODIUM: 142 mmol/L (ref 135–145)
TOTAL PROTEIN: 8.5 g/dL — AB (ref 6.5–8.1)

## 2016-12-28 LAB — CBC
HEMATOCRIT: 39.8 % (ref 35.0–47.0)
HEMOGLOBIN: 13.4 g/dL (ref 12.0–16.0)
MCH: 29.2 pg (ref 26.0–34.0)
MCHC: 33.6 g/dL (ref 32.0–36.0)
MCV: 87 fL (ref 80.0–100.0)
Platelets: 452 10*3/uL — ABNORMAL HIGH (ref 150–440)
RBC: 4.58 MIL/uL (ref 3.80–5.20)
RDW: 14.1 % (ref 11.5–14.5)
WBC: 13.6 10*3/uL — ABNORMAL HIGH (ref 3.6–11.0)

## 2016-12-28 LAB — ETHANOL: Alcohol, Ethyl (B): 169 mg/dL — ABNORMAL HIGH (ref <5)

## 2016-12-28 LAB — PROTIME-INR
INR: 0.87
PROTHROMBIN TIME: 11.8 s (ref 11.4–15.2)

## 2016-12-28 LAB — POCT PREGNANCY, URINE: Preg Test, Ur: NEGATIVE

## 2016-12-28 LAB — APTT: aPTT: 32 seconds (ref 24–36)

## 2016-12-28 MED ORDER — ONDANSETRON 4 MG PO TBDP
4.0000 mg | ORAL_TABLET | Freq: Once | ORAL | Status: AC
  Administered 2016-12-28: 4 mg via ORAL
  Filled 2016-12-28: qty 1

## 2016-12-28 MED ORDER — IBUPROFEN 400 MG PO TABS
400.0000 mg | ORAL_TABLET | Freq: Once | ORAL | Status: AC
  Administered 2016-12-28: 400 mg via ORAL
  Filled 2016-12-28: qty 1

## 2016-12-28 MED ORDER — LORAZEPAM 2 MG/ML IJ SOLN
1.0000 mg | Freq: Once | INTRAMUSCULAR | Status: AC
  Administered 2016-12-28: 1 mg via INTRAVENOUS
  Filled 2016-12-28: qty 1

## 2016-12-28 NOTE — ED Notes (Signed)
Pt called me back in the room to states [facility] misunderstood [facility]. Asks if she can go home now.

## 2016-12-28 NOTE — ED Notes (Signed)
Mother at the front desk requesting that pt just be monitored in the er for a few hours and released home. States she will stay with pt overnight. Will advised admitting dr of request.

## 2016-12-28 NOTE — ED Notes (Signed)
Pt upset and wanting to leave. Husband in bed with pt and mother at bedside. Pt medicated for ct. Husband asked to get out of the bed. Advised with head injury pt needs to remain calm and only one visitor at a time. Husband asking for 5610 month old to be brought back. Advised no at this time.

## 2016-12-28 NOTE — ED Notes (Signed)
Pt will be discharged per dr order

## 2016-12-28 NOTE — ED Provider Notes (Signed)
[facility] Emergency Department Provider Note   ____________________________________________    I have reviewed the triage vital signs and the nursing notes.   HISTORY  Chief Complaint Fall and Altered Mental Status   Patient unable to provide significant history due to altered mental status  HPI Kelly Huber is a 41 y.o. female who presents with altered mental status. Per EMS patient and her husband had a few drinks of alcohol. Reportedly approximately half an hour ago the patient fell down 6 steps but the husband reported to EMS that he was able to partially catch her. Patient has apparently been confused over the last 20 minutes or so.she is unable to answer questions appropriately. Apparently she was complaining of head and neck pain before this began. No history of similar events reported. EMS reports no evidence of drug use   Past Medical History:  Diagnosis Date  . Advanced maternal age in multigravida   . Chronic hypertension   . Gestational diabetes   . History of postpartum depression   . Marijuana use     Patient Active Problem List   Diagnosis Date Noted  . Gestational hypertension 02/29/2016  . Hypertension affecting pregnancy in third trimester 02/28/2016  . Threatened preterm labor 01/16/2016    Past Surgical History:  Procedure Laterality Date  . cataract surgery    . dilation and currette      Prior to Admission medications   Medication Sig Start Date End Date Taking? Authorizing Provider  ibuprofen (ADVIL,MOTRIN) 600 MG tablet Take 1 tablet (600 mg total) by mouth every 6 (six) hours as needed. 03/02/16   Nadara Mustard, MD  labetalol (NORMODYNE) 300 MG tablet Take 1 tablet (300 mg total) by mouth 3 (three) times daily. 03/02/16   Nadara Mustard, MD  norethindrone (CAMILA) 0.35 MG tablet Take 1 tablet (0.35 mg total) by mouth daily. 03/17/16   Nadara Mustard, MD  oxyCODONE-acetaminophen (PERCOCET/ROXICET) 5-325  MG tablet Take 1 tablet by mouth every 4 (four) hours as needed (pain scale > 7). 03/02/16   Nadara Mustard, MD  Prenatal Vit-Fe Fumarate-FA (MULTIVITAMIN-PRENATAL) 27-0.8 MG TABS tablet Take 1 tablet by mouth daily at 12 noon.    [provider]     Allergies Codeine and Penicillins  Family History  Problem Relation Age of Onset  . Hypertension Mother   . Bipolar disorder Father   . Bipolar disorder Brother        bothers x2    Social History Social History  Substance Use Topics  . Smoking status: Current Some Day Smoker    Packs/day: 0.25    Years: 20.00    Types: Cigarettes    Last attempt to quit: 12/19/2015  . Smokeless tobacco: Never Used  . Alcohol use No    level V caveat: Unable to obtainReview of Systems     ____________________________________________   PHYSICAL EXAM:  VITAL SIGNS: ED Triage Vitals [12/28/16 2004]  Enc Vitals Group     BP      Pulse      Resp      Temp      Temp src      SpO2      Weight 81.6 kg (180 lb)     Height      Head Circumference      Peak Flow      Pain Score      Pain Loc      Pain Edu?  Excl. in GC?     Constitutional: Alert but confused Eyes: Conjunctivae are red, PERRLA, no nystagmus Head: no hematoma or bleeding or laceration Nose: No congestion/rhinnorhea. Mouth/Throat: Mucous membranes are moist.   Neck:  c-collar in place, no vertebral tenderness to palpation Cardiovascular: Normal rate, regular rhythm. Grossly normal heart sounds.  Good peripheral circulation. Respiratory: Normal respiratory effort.  No retractions. Lungs CTAB. Gastrointestinal: Soft and nontender. No distention.  No CVA tenderness. Genitourinary: deferred Musculoskeletal: No lower extremity tenderness nor edema.  Warm and well perfused Neurologic:   Patient moves all extremities, Skin:  Skin is warm, dry and intact. No rash noted.   ____________________________________________   LABS (all labs ordered are listed, but  only abnormal results are displayed)  Labs Reviewed  CBC  COMPREHENSIVE METABOLIC PANEL  APTT  PROTIME-INR  URINALYSIS, COMPLETE (UACMP) WITH MICROSCOPIC  URINE DRUG SCREEN, QUALITATIVE ([facility] ONLY)  ETHANOL   ____________________________________________  EKG  ED ECG REPORT I, Jene EveryKINNER, Analayah Brooke, the attending physician, personally viewed and interpreted this ECG.  Date: 12/28/2016  Rate: 105 Rhythm: sinus tachycardia QRS Axis: normal Intervals: normal ST/T Wave abnormalities: normal Narrative Interpretation: unremarkable  ____________________________________________  RADIOLOGY  CT head and cervical spine negative ____________________________________________   PROCEDURES  Procedure(s) performed: No    Critical Care performed: No ____________________________________________   INITIAL IMPRESSION / ASSESSMENT AND PLAN / ED COURSE  Pertinent labs & imaging results that were available during my care of the patient were reviewed by me and considered in my medical decision making (see chart for details).  Patient presents with altered mental status. Alcohol use today. Also potential fall injury. We will obtain CT head, cervical spine, obtain labs, UDS give IV fluids and monitor carefully  Patient was sitting up and confused and required 1 mg of Ativan in order to enable CT head  ----------------------------------------- 9:34 PM on 12/28/2016 -----------------------------------------  CT head unremarkable. Lab work overall reassuring. Mildly elevated white blood cell count but no evidence of infection. Afebrile. Positive ethanol and marijuana. patient is still altered, I will admit her to the hospitalist service for further evaluation    ____________________________________________   FINAL CLINICAL IMPRESSION(S) / ED DIAGNOSES  Final diagnoses:  Altered mental status, unspecified altered mental status type  Confusion      NEW MEDICATIONS STARTED DURING THIS  VISIT:  New Prescriptions   No medications on file     Note:  This document was prepared using Dragon voice recognition software and may include unintentional dictation errors.    Jene EveryKinner, Niajah Sipos, MD 12/28/16 [phone]

## 2016-12-28 NOTE — ED Notes (Signed)
Esign not working at this time. Pt verbalized discharge instructions and has no questions at this time. 

## 2016-12-28 NOTE — ED Notes (Signed)
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 Does not currently use a cane but advised she should. Speaking completely clear and able to relate all this information to me clearly and concisely.

## 2016-12-28 NOTE — ED Triage Notes (Addendum)
Per EMS. Patient fell down 6-8 steps.   PT did not recognize spouse and family after fall. Pt oriented to self only after fall.  Pt c/o neck/head pain.  Patient anxious and confused at arrival to ED.  Pt has pressured/stuttering speech.   Patient has had at least 3 shots of alcohol.

## 2016-12-28 NOTE — Consult Note (Signed)
[facility] - [place] at [facility]   PATIENT NAME: Kelly Huber    MR#:  [id number]  DATE OF BIRTH:  Dec 29, 1975  DATE OF ADMISSION:  12/28/2016  PRIMARY CARE PHYSICIAN: Sandrea Hughs, NP   REQUESTING/REFERRING PHYSICIAN: Cyril Loosen, MD  CHIEF COMPLAINT:   Chief Complaint  Patient presents with  . Fall  . Altered Mental Status    HISTORY OF PRESENT ILLNESS:  Kelly Huber  is a 41 y.o. female who presents with Fall and confusion.  Patient initially was unable to relay much of her history coherently.  Family reported she fell down the stairs in her home.  She was reported to have had a couple shots of rum earlier in the evening.  Her imaging here was without significant abnormality.  Her alcohol level was elevated at 169.  She was also positive for cannabinoids on urine tox screen.  Hospitals were called for admission and further evaluation. However, by the time this Clinical research associate interviewed her she seemed to be more coherent in terms of her cognitive status, she was able to give me most of the history on her own. She stated that she did not want to be admitted to the hospital. I explained to her our concern for possible cardiac cause, especially given uncertainty as to whether or not she had experienced true syncope with this fall. Patient described that she had some shortness of breath and could not remember the entire events surrounding the fall. She is 10 months postpartum. Despite all this she was able to walk fairly steady on her feet for the nurses, and was adamant that she did not want to remain in the hospital.  PAST MEDICAL HISTORY:   Past Medical History:  Diagnosis Date  . Advanced maternal age in multigravida   . Chronic hypertension   . Gestational diabetes   . History of postpartum depression   . Marijuana use     PAST SURGICAL HISTOIRY:   Past Surgical History:  Procedure Laterality Date  . cataract surgery    . dilation and currette       SOCIAL HISTORY:   Social History  Substance Use Topics  . Smoking status: Current Some Day Smoker    Packs/day: 0.25    Years: 20.00    Types: Cigarettes    Last attempt to quit: 12/19/2015  . Smokeless tobacco: Never Used  . Alcohol use No    FAMILY HISTORY:   Family History  Problem Relation Age of Onset  . Hypertension Mother   . Bipolar disorder Father   . Bipolar disorder Brother        bothers x2    DRUG ALLERGIES:   Allergies  Allergen Reactions  . Codeine Hives and Shortness Of Breath  . Penicillins Hives and Shortness Of Breath    REVIEW OF SYSTEMS:  ROS  MEDICATIONS AT HOME:   Prior to Admission medications   Medication Sig Start Date End Date Taking? Authorizing Provider  DUREZOL 0.05 % EMUL Place 1 drop into both eyes 4 (four) times daily.   Yes [provider]  NIFEdipine (PROCARDIA XL/ADALAT-CC) 90 MG 24 hr tablet Take 90 mg by mouth daily. for blood pressure   Yes [provider]  tiZANidine (ZANAFLEX) 4 MG tablet Take 2-4 mg by mouth 3 (three) times daily as needed.   Yes [provider]      VITAL SIGNS:   Vitals:   12/28/16 2030 12/28/16 2130 12/28/16 2200 12/28/16 2230  BP: Marland Kitchen)  135/107 (!) 130/92 (!) 145/105 (!) 136/98  Pulse: (!) 115 94 97   Resp: (!) 29 18 18    Temp:      TempSrc:      SpO2: 100% 98% 100%   Weight:       Wt Readings from Last 3 Encounters:  12/28/16 81.6 kg (180 lb)  02/29/16 83.5 kg (184 lb)  02/20/16 82.4 kg (181 lb 9.6 oz)    PHYSICAL EXAMINATION:  Physical Exam   LABORATORY PANEL:   CBC  Recent Labs Lab 12/28/16 2008  WBC 13.6*  HGB 13.4  HCT 39.8  PLT 452*   ------------------------------------------------------------------------------------------------------------------  Chemistries   Recent Labs Lab 12/28/16 2008  NA 142  K 3.3*  CL 112*  CO2 21*  GLUCOSE 108*  BUN 9  CREATININE 0.88  CALCIUM 9.3  AST 25  ALT 20  ALKPHOS 87  BILITOT 0.5    ------------------------------------------------------------------------------------------------------------------  Cardiac Enzymes No results for input(s): TROPONINI in the last 168 hours. ------------------------------------------------------------------------------------------------------------------  RADIOLOGY:  Ct Head Wo Contrast  Result Date: 12/28/2016 CLINICAL DATA:  Initial evaluation for acute trauma, fall, altered mental status. EXAM: CT HEAD WITHOUT CONTRAST CT CERVICAL SPINE WITHOUT CONTRAST TECHNIQUE: Multidetector CT imaging of the head and cervical spine was performed following the standard protocol without intravenous contrast. Multiplanar CT image reconstructions of the cervical spine were also generated. COMPARISON:  None. FINDINGS: CT HEAD FINDINGS Brain: Cerebral volume within normal limits for patient age. No evidence for acute intracranial hemorrhage. No findings to suggest acute large vessel territory infarct. No mass lesion, midline shift, or mass effect. Ventricles are normal in size without evidence for hydrocephalus. No extra-axial fluid collection identified. Vascular: No hyperdense vessel identified. Skull: Scalp soft tissues demonstrate no acute abnormality.Calvarium intact. Sinuses/Orbits: Globes and orbital soft tissues are within normal limits. Trace layering fluid within the right sphenoid sinus. Paranasal sinuses are otherwise clear. No mastoid effusion. CT CERVICAL SPINE FINDINGS Alignment: Straightening of the normal cervical lordosis. No listhesis. Skull base and vertebrae: Skullbase intact. Normal C1-2 articulations preserved. Dens is intact. Vertebral body heights maintained. No acute fracture. Soft tissues and spinal canal: Visualized soft tissues of the neck demonstrate no acute abnormality. No prevertebral edema. Disc levels: No significant degenerative changes within cervical spine. Upper chest: Visualized upper chest is unremarkable. Visualized lung apices  are clear. No apical pneumothorax. Mild paraseptal emphysema. Other: No other significant finding. IMPRESSION: 1. Negative head CT.  No acute intracranial process. 2. No acute traumatic injury within the cervical spine. Electronically Signed   By: Rise MuBenjamin  McClintock M.D.   On: 12/28/2016 21:29   Ct Cervical Spine Wo Contrast  Result Date: 12/28/2016 CLINICAL DATA:  Initial evaluation for acute trauma, fall, altered mental status. EXAM: CT HEAD WITHOUT CONTRAST CT CERVICAL SPINE WITHOUT CONTRAST TECHNIQUE: Multidetector CT imaging of the head and cervical spine was performed following the standard protocol without intravenous contrast. Multiplanar CT image reconstructions of the cervical spine were also generated. COMPARISON:  None. FINDINGS: CT HEAD FINDINGS Brain: Cerebral volume within normal limits for patient age. No evidence for acute intracranial hemorrhage. No findings to suggest acute large vessel territory infarct. No mass lesion, midline shift, or mass effect. Ventricles are normal in size without evidence for hydrocephalus. No extra-axial fluid collection identified. Vascular: No hyperdense vessel identified. Skull: Scalp soft tissues demonstrate no acute abnormality.Calvarium intact. Sinuses/Orbits: Globes and orbital soft tissues are within normal limits. Trace layering fluid within the right sphenoid sinus. Paranasal sinuses are otherwise clear. No mastoid  effusion. CT CERVICAL SPINE FINDINGS Alignment: Straightening of the normal cervical lordosis. No listhesis. Skull base and vertebrae: Skullbase intact. Normal C1-2 articulations preserved. Dens is intact. Vertebral body heights maintained. No acute fracture. Soft tissues and spinal canal: Visualized soft tissues of the neck demonstrate no acute abnormality. No prevertebral edema. Disc levels: No significant degenerative changes within cervical spine. Upper chest: Visualized upper chest is unremarkable. Visualized lung apices are clear. No  apical pneumothorax. Mild paraseptal emphysema. Other: No other significant finding. IMPRESSION: 1. Negative head CT.  No acute intracranial process. 2. No acute traumatic injury within the cervical spine. Electronically Signed   By: Rise Mu M.D.   On: 12/28/2016 21:29   Dg Chest Port 1 View  Result Date: 12/28/2016 CLINICAL DATA:  Altered mental status.  Fell down 6-8 steps. EXAM: PORTABLE CHEST 1 VIEW COMPARISON:  04/25/2009. FINDINGS: The heart size and mediastinal contours are within normal limits. Both lungs are clear. The visualized skeletal structures are unremarkable. IMPRESSION: Normal examination. Electronically Signed   By: Beckie Salts M.D.   On: 12/28/2016 20:48    EKG:   Orders placed or performed during the hospital encounter of 12/28/16  . ED EKG  . ED EKG  . EKG 12-Lead  . EKG 12-Lead  . EKG 12-Lead  . EKG 12-Lead    IMPRESSION AND PLAN:  Principal Problem:   Alcohol intoxication with delirium (HCC) - discussed with the patient that she needs to be accompanied home tonight, she needs to not be up and about, but remained resting until the alcohol can get out of her system. It is possible that her fall and mental status tonight are related to alcohol use, but given some of her history cardiac etiology cannot be entirely ruled out without further studies. Active Problems:   Syncope/near syncope - strongly recommended to the patient that she remain tonight for observation so that we can monitor her cardiac rhythm and get an echocardiogram in the morning. She is adamant about going home, so I discussed with patient the importance of having this workup done in the outpatient setting as she is refusing to stay.   HTN (hypertension) - continue home medications and follow up with outpatient primary provider  All the records are reviewed and case discussed with ED provider. Management plans discussed with the patient and/or family.  TOTAL TIME TAKING CARE OF THIS  PATIENT: 40 minutes.    Avion Kutzer FIELDING 12/28/2016, 10:58 PM  [facility]  Office  [phone]  CC: Primary care Physician: Sandrea Hughs, NP  Note:  This document was prepared using Dragon voice recognition software and may include unintentional dictation errors.

## 2016-12-28 NOTE — ED Notes (Signed)
Pt confused, repeating herself with pressured speech. Pt fell down approx 6 steps. etoh onboard but pt unable to say how much. Pt a&ox1

## 2016-12-28 NOTE — ED Provider Notes (Signed)
Patient seen by Dr. Anne HahnWillis. Apparently she does not want to stay in the hospital. she is leaving AGAINST MEDICAL ADVICE.   Jene EveryKinner, Dhalia Zingaro, MD 12/28/16 [id number]

## 2017-12-17 ENCOUNTER — Emergency Department: Payer: Medicaid Other

## 2017-12-17 ENCOUNTER — Encounter: Payer: Self-pay | Admitting: *Deleted

## 2017-12-17 ENCOUNTER — Other Ambulatory Visit: Payer: Self-pay

## 2017-12-17 ENCOUNTER — Emergency Department
Admission: EM | Admit: 2017-12-17 | Discharge: 2017-12-18 | Disposition: A | Discharge status: Home / Self Care | Payer: Medicaid Other | Attending: Emergency Medicine | Admitting: Emergency Medicine

## 2017-12-17 DIAGNOSIS — Z79899 Other long term (current) drug therapy: Secondary | ICD-10-CM | POA: Diagnosis not present

## 2017-12-17 DIAGNOSIS — R05 Cough: Secondary | ICD-10-CM | POA: Insufficient documentation

## 2017-12-17 DIAGNOSIS — J111 Influenza due to unidentified influenza virus with other respiratory manifestations: Secondary | ICD-10-CM | POA: Insufficient documentation

## 2017-12-17 DIAGNOSIS — L52 Erythema nodosum: Secondary | ICD-10-CM | POA: Diagnosis not present

## 2017-12-17 DIAGNOSIS — J3489 Other specified disorders of nose and nasal sinuses: Secondary | ICD-10-CM | POA: Insufficient documentation

## 2017-12-17 DIAGNOSIS — R51 Headache: Secondary | ICD-10-CM | POA: Diagnosis not present

## 2017-12-17 DIAGNOSIS — M7918 Myalgia, other site: Secondary | ICD-10-CM | POA: Insufficient documentation

## 2017-12-17 DIAGNOSIS — R21 Rash and other nonspecific skin eruption: Secondary | ICD-10-CM | POA: Insufficient documentation

## 2017-12-17 DIAGNOSIS — J069 Acute upper respiratory infection, unspecified: Secondary | ICD-10-CM | POA: Diagnosis not present

## 2017-12-17 DIAGNOSIS — R638 Other symptoms and signs concerning food and fluid intake: Secondary | ICD-10-CM | POA: Insufficient documentation

## 2017-12-17 DIAGNOSIS — I10 Essential (primary) hypertension: Secondary | ICD-10-CM | POA: Insufficient documentation

## 2017-12-17 DIAGNOSIS — R509 Fever, unspecified: Secondary | ICD-10-CM | POA: Diagnosis present

## 2017-12-17 DIAGNOSIS — F17210 Nicotine dependence, cigarettes, uncomplicated: Secondary | ICD-10-CM | POA: Diagnosis not present

## 2017-12-17 DIAGNOSIS — R5383 Other fatigue: Secondary | ICD-10-CM | POA: Diagnosis not present

## 2017-12-17 DIAGNOSIS — R110 Nausea: Secondary | ICD-10-CM | POA: Diagnosis not present

## 2017-12-17 LAB — BASIC METABOLIC PANEL
Anion gap: 9 (ref 5–15)
BUN: 7 mg/dL (ref 6–20)
CO2: 21 mmol/L — AB (ref 22–32)
Calcium: 9.2 mg/dL (ref 8.9–10.3)
Chloride: 106 mmol/L (ref 98–111)
Creatinine, Ser: 0.95 mg/dL (ref 0.44–1.00)
GFR calc Af Amer: >60 mL/min (ref >60)
GLUCOSE: 100 mg/dL — AB (ref 70–99)
Potassium: 3.8 mmol/L (ref 3.5–5.1)
Sodium: 136 mmol/L (ref 135–145)

## 2017-12-17 LAB — CBC WITH DIFFERENTIAL/PLATELET
BASOS ABS: 0.1 10*3/uL (ref 0–0.1)
Basophils Relative: 1 %
Eosinophils Absolute: 0.1 10*3/uL (ref 0–0.7)
Eosinophils Relative: 1 %
HCT: 37 % (ref 35.0–47.0)
HEMOGLOBIN: 12.9 g/dL (ref 12.0–16.0)
LYMPHS ABS: 3 10*3/uL (ref 1.0–3.6)
LYMPHS PCT: 19 %
MCH: 29.8 pg (ref 26.0–34.0)
MCHC: 34.7 g/dL (ref 32.0–36.0)
MCV: 85.8 fL (ref 80.0–100.0)
Monocytes Absolute: 1.4 10*3/uL — ABNORMAL HIGH (ref 0.2–0.9)
Monocytes Relative: 9 %
NEUTROS ABS: 11.2 10*3/uL — AB (ref 1.4–6.5)
NEUTROS PCT: 70 %
Platelets: 378 10*3/uL (ref 150–440)
RBC: 4.32 MIL/uL (ref 3.80–5.20)
RDW: 14.4 % (ref 11.5–14.5)
WBC: 15.9 10*3/uL — AB (ref 3.6–11.0)

## 2017-12-17 LAB — GROUP A STREP BY PCR: GROUP A STREP BY PCR: NOT DETECTED

## 2017-12-17 LAB — URINALYSIS, COMPLETE (UACMP) WITH MICROSCOPIC
BILIRUBIN URINE: NEGATIVE
GLUCOSE, UA: NEGATIVE mg/dL
KETONES UR: NEGATIVE mg/dL
Nitrite: NEGATIVE
PH: 5 (ref 5.0–8.0)
Protein, ur: NEGATIVE mg/dL
Specific Gravity, Urine: 1.01 (ref 1.005–1.030)

## 2017-12-17 LAB — SEDIMENTATION RATE: Sed Rate: 45 mm/hr — ABNORMAL HIGH (ref 0–20)

## 2017-12-17 MED ORDER — SODIUM CHLORIDE 0.9 % IV BOLUS
1000.0000 mL | Freq: Once | INTRAVENOUS | Status: AC
  Administered 2017-12-17: 1000 mL via INTRAVENOUS

## 2017-12-17 MED ORDER — KETOROLAC TROMETHAMINE 30 MG/ML IJ SOLN
30.0000 mg | Freq: Once | INTRAMUSCULAR | Status: AC
  Administered 2017-12-17: 30 mg via INTRAVENOUS
  Filled 2017-12-17: qty 1

## 2017-12-17 MED ORDER — ONDANSETRON HCL 4 MG/2ML IJ SOLN
4.0000 mg | Freq: Once | INTRAMUSCULAR | Status: AC
  Administered 2017-12-17: 4 mg via INTRAVENOUS
  Filled 2017-12-17: qty 2

## 2017-12-17 MED ORDER — ORPHENADRINE CITRATE 30 MG/ML IJ SOLN
60.0000 mg | INTRAMUSCULAR | Status: AC
  Administered 2017-12-17: 60 mg via INTRAMUSCULAR
  Filled 2017-12-17: qty 2

## 2017-12-17 MED ORDER — METHYLPREDNISOLONE SODIUM SUCC 125 MG IJ SOLR
125.0000 mg | Freq: Once | INTRAMUSCULAR | Status: AC
  Administered 2017-12-17: 125 mg via INTRAVENOUS
  Filled 2017-12-17: qty 2

## 2017-12-17 NOTE — ED Notes (Signed)
Patient states "I went to urgent care Friday and they told me I had the flu and to take ibuprofen and tamiflu. Then I noticed I had boils under my arms and red bumps all over me. I saw my PCP and she gave me antibiotics. Im not getting any relief. Every bone in my body hurts and I cant even describe the pain I feel at night. I have red bumps all over me" Patient has rash (red bumps) all over body.

## 2017-12-17 NOTE — ED Triage Notes (Addendum)
Pt to ED reporting she was dx with the flu over the weekend. Pt reports she has been taking tamiflu and tylenol but has not been getting better. Fever continues to return per pt. Last does of tylenol was at 1400 and pt is afebrile at this time. Decreased appetite. No vomiting. No diarrhea.   Pt was placed on antibiotics for boils under her arm.

## 2017-12-17 NOTE — ED Provider Notes (Signed)
[facility] Emergency Department Provider Note ____________________________________________  Time seen: 1816  I have reviewed the triage vital signs and the nursing notes.  HISTORY  Chief Complaint  Influenza  HPI Kelly Huber is a 42 y.o. female presents to the ED for evaluation of continued fevers, body aches, as well as development of a painful rash of the body.  Patient describes onset of severe headache on Thursday, she also noted several large tender, red bumps primarily over her left thigh.  She went to a local urgent care on Friday, and was confirmed to have influenza.  She was diagnosed testing, and discharged with a prescription for Tamiflu and ibuprofen.  She then developed some boils under her armpits, and the large red bumps began to spread over the entire leg as well as the trunk.  She was evaluated by her PCP on Sunday, and started on doxycycline.  She denies any significant benefit from any medication she is taking.  She describes tenderness to the red nodules across her extremities, fatigue, headache, joint pain, muscle pain, and body aches.  She is also noted some nausea without vomiting intermittently.  She is a decreased appetite.  She denies any sick travel, recent contacts, tick bites, or other exposures.  She denies neck stiffness, vision change, or syncope.  She does report some sinus drainage and a mild cough.  She reports urination without difficulty and denies any diarrhea. She is here for further evaluation of a tender rash, malaise, and fevers.  Past Medical History:  Diagnosis Date  . Advanced maternal age in multigravida   . Chronic hypertension   . Gestational diabetes   . History of postpartum depression   . Marijuana use     Patient Active Problem List   Diagnosis Date Noted  . Alcohol intoxication with delirium (HCC) 12/28/2016  . Marijuana abuse 12/28/2016  . HTN (hypertension) 12/28/2016  . Alcohol intoxication delirium (HCC)  12/28/2016  . Gestational hypertension 02/29/2016  . Threatened preterm labor 01/16/2016    Past Surgical History:  Procedure Laterality Date  . cataract surgery    . dilation and currette      Prior to Admission medications   Medication Sig Start Date End Date Taking? Authorizing Provider  azithromycin (ZITHROMAX Z-PAK) 250 MG tablet Take 1 tablet (250 mg total) by mouth daily for 4 days. 12/18/17 12/22/17  Antonina Deziel, Charlesetta IvoryJenise V Bacon, PA-C  cyclobenzaprine (FLEXERIL) 5 MG tablet Take 1 tablet (5 mg total) by mouth 3 (three) times daily as needed for muscle spasms. 12/18/17   Talani Brazee, Charlesetta IvoryJenise V Bacon, PA-C  DUREZOL 0.05 % EMUL Place 1 drop into both eyes 4 (four) times daily.    [provider]  HYDROcodone-acetaminophen (NORCO) 5-325 MG tablet Take 1 tablet by mouth 3 (three) times daily as needed for up to 3 days. 12/18/17 12/21/17  Leiland Mihelich, Charlesetta IvoryJenise V Bacon, PA-C  NIFEdipine (PROCARDIA XL/ADALAT-CC) 90 MG 24 hr tablet Take 90 mg by mouth daily. for blood pressure    [provider]  ondansetron (ZOFRAN ODT) 4 MG disintegrating tablet Take 1 tablet (4 mg total) by mouth every 8 (eight) hours as needed. 12/18/17   Nawal Burling, Charlesetta IvoryJenise V Bacon, PA-C  predniSONE (DELTASONE) 10 MG tablet Take 1 tablet (10 mg total) by mouth 2 (two) times daily with a meal. 12/18/17   Devantae Babe, Charlesetta IvoryJenise V Bacon, PA-C  tiZANidine (ZANAFLEX) 4 MG tablet Take 2-4 mg by mouth 3 (three) times daily as needed.    [provider]  Allergies Codeine and Penicillins  Family History  Problem Relation Age of Onset  . Hypertension Mother   . Bipolar disorder Father   . Bipolar disorder Brother        bothers x2    Social History Social History   Tobacco Use  . Smoking status: Current Some Day Smoker    Packs/day: 0.25    Years: 20.00    Pack years: 5.00    Types: Cigarettes    Last attempt to quit: 12/19/2015    Years since quitting: 2.0  . Smokeless tobacco: Never Used  Substance Use Topics  . Alcohol  use: No  . Drug use: No    Types: Marijuana    Comment: none in last month    Review of Systems  Constitutional: positive for fever. Eyes: Negative for visual changes. ENT: Negative for sore throat. Cardiovascular: Negative for chest pain. Respiratory: Negative for shortness of breath. Reports cough Gastrointestinal: Negative for abdominal pain, vomiting and diarrhea. Reports nausea  Genitourinary: Negative for dysuria. Musculoskeletal: Negative for back pain. Skin: Positive for rash. Neurological: Negative for focal weakness or numbness. Notes headaches.  ____________________________________________  PHYSICAL EXAM:  VITAL SIGNS: ED Triage Vitals [12/17/17 1757]  Enc Vitals Group     BP (!) 148/97     Pulse Rate (!) 104     Resp 16     Temp 99.8 F (37.7 C)     Temp Source Oral     SpO2 100 %     Weight 157 lb (71.2 kg)     Height 5\' 4"  (1.626 m)     Head Circumference      Peak Flow      Pain Score 10     Pain Loc      Pain Edu?      Excl. in [place]?     Constitutional: Alert and oriented. Well appearing and in no distress. Patient appears uncomfortable due to myalgias.  Head: Normocephalic and atraumatic. Eyes: Conjunctivae are normal. PERRL. Normal extraocular movements Ears: Canals clear. TMs intact bilaterally. Nose: No congestion/rhinorrhea/epistaxis. Mouth/Throat: Mucous membranes are moist.  Uvula is midline and tonsils are flat.  No oral lesions are appreciated.  No petechiae are noted. Neck: Supple. Normal range of motion.  No distracting midline tenderness is noted.  No spasm or deformity appreciated.  No nuchal rigidity is elicited. Hematological/Lymphatic/Immunological: No cervical lymphadenopathy. Cardiovascular: Normal rate, regular rhythm. Normal distal pulses. Respiratory: Normal respiratory effort. No wheezes/rales/rhonchi. Intermittent cough.  Gastrointestinal: Soft and nontender. No distention. Musculoskeletal: Nontender with normal range of motion  in all extremities.  Neurologic: Cranial nerves II through XII grossly intact.  Normal gait without ataxia. Normal speech and language. No gross focal neurologic deficits are appreciated.  Negative Kernig and Brudzinski signs. Skin:  Skin is warm, dry and intact. Patient with multiple, tender, well-demarcated, nummular, erythematous, non-blanchable, maculopapular lesions, ranging in size from 1-3 cm in diameter; across the extremities.  No petechiae, no ecchymosis, no skin desquamation, vesicle or blister formation is noted.  No palmar or sole lesions are appreciated. Psychiatric: Mood and affect are normal. Patient exhibits appropriate insight and judgment. ____________________________________________   LABS (pertinent positives/negatives)  Labs Reviewed  URINALYSIS, COMPLETE (UACMP) WITH MICROSCOPIC - Abnormal; Notable for the following components:      Result Value   Color, Urine YELLOW (*)    APPearance HAZY (*)    Hgb urine dipstick SMALL (*)    Leukocytes, UA SMALL (*)    Bacteria, UA RARE (*)  All other components within normal limits  BASIC METABOLIC PANEL - Abnormal; Notable for the following components:   CO2 21 (*)    Glucose, Bld 100 (*)    All other components within normal limits  CBC WITH DIFFERENTIAL/PLATELET - Abnormal; Notable for the following components:   WBC 15.9 (*)    Neutro Abs 11.2 (*)    Monocytes Absolute 1.4 (*)    All other components within normal limits  SEDIMENTATION RATE - Abnormal; Notable for the following components:   Sed Rate 45 (*)    All other components within normal limits  GROUP A STREP BY PCR  C-REACTIVE PROTEIN  HEPATIC FUNCTION PANEL  ____________________________________________   RADIOLOGY  CXR Negative  ____________________________________________  PROCEDURES  Procedures NS 1000 ml bolus IVP Ketorolac 10 mg IVP Norflex 60 mg IVP Solu-medrol 125 mg IVP Reglan 10 mg PO Azithromycin 500 mg PO Norco 5-325 mg  PO ____________________________________________  INITIAL IMPRESSION / ASSESSMENT AND PLAN / ED COURSE  ----------------------------------------- 2145 on 12/17/2017 ----------------------------------------- Discussed the case with Dr. Burlene Arnt. He suggests strep test and lumbar puncture if symptoms are consistent with possible meningitis.   DDX: influenza, CAP, URI, meningitis, erythema nodosum, erythema multiforme, lyme disease  Patient with ED evaluation of continued myalgias, fevers, and malaise following a recent diagnosis of influenza.  Patient is also presenting with erythematous, tender maculopapular lesions to the extremities.  Symptoms likely represent an erythema nodosum.  Patient's labs confirm a leukocytosis with a white shift and elevation in monocytes.  Her strep test is negative at this time.  Her chest x-ray is reassuring as it shows no acute process.  Overall likely viral URI etiology including influenza as the underlying cause of the patient's symptoms.  We discussed the low possibility of meningitis as an etiology, and offer the patient a lumbar puncture. She declined a LP at this time. The EN appears to be consistent with this clinical picture and diagnosis.  The patient been given IV fluids and pain medicines in the ED.  She will be treated empirically with a azithromycin and prednisone.  She is asked at this time to discontinue the Tamiflu and the doxycycline as previously prescribed.  She should follow-up with the primary care provider or return to the ED as discussed for acutely worsening symptoms.  Patient and her husband verbalized understanding of discharge instructions and are reassured by the diagnosis and labs provided today.  I reviewed the patient's prescription history over the last 12 months in the multi-state controlled substances database(s) that includes [place], [place], [place], [place], [place], [place], [place], [place], [place], [place],  [place], [place], [place], and [place].  Results were notable for no current prescriptions.  ____________________________________________  FINAL CLINICAL IMPRESSION(S) / ED DIAGNOSES  Final diagnoses:  Erythema nodosum  Influenza  Viral upper respiratory tract infection      Melvenia Needles, PA-C 12/18/17 0043    Schuyler Amor, MD 12/18/17 [phone]

## 2017-12-17 NOTE — ED Notes (Signed)
Pt gone to medical imaging.  

## 2017-12-18 LAB — HEPATIC FUNCTION PANEL
ALT: 53 U/L — AB (ref 0–44)
AST: 54 U/L — ABNORMAL HIGH (ref 15–41)
Albumin: 4 g/dL (ref 3.5–5.0)
Alkaline Phosphatase: 130 U/L — ABNORMAL HIGH (ref 38–126)
Bilirubin, Direct: <0.1 mg/dL (ref 0.0–0.2)
Total Bilirubin: 0.4 mg/dL (ref 0.3–1.2)
Total Protein: 7.8 g/dL (ref 6.5–8.1)

## 2017-12-18 MED ORDER — METOCLOPRAMIDE HCL 10 MG PO TABS
10.0000 mg | ORAL_TABLET | Freq: Once | ORAL | Status: AC
  Administered 2017-12-18: 10 mg via ORAL
  Filled 2017-12-18: qty 1

## 2017-12-18 MED ORDER — PREDNISONE 10 MG PO TABS: 10.0000 mg | ORAL_TABLET | Freq: Two times a day (BID) | ORAL | 0 refills | Status: DC

## 2017-12-18 MED ORDER — HYDROCODONE-ACETAMINOPHEN 5-325 MG PO TABS
1.0000 | ORAL_TABLET | Freq: Once | ORAL | Status: AC
  Administered 2017-12-18: 1 via ORAL
  Filled 2017-12-18: qty 1

## 2017-12-18 MED ORDER — AZITHROMYCIN 500 MG PO TABS
500.0000 mg | ORAL_TABLET | Freq: Once | ORAL | Status: AC
  Administered 2017-12-18: 500 mg via ORAL
  Filled 2017-12-18: qty 1

## 2017-12-18 MED ORDER — ONDANSETRON 4 MG PO TBDP: 4.0000 mg | ORAL_TABLET | Freq: Three times a day (TID) | ORAL | 0 refills | Status: DC | PRN

## 2017-12-18 MED ORDER — AZITHROMYCIN 250 MG PO TABS: 250.0000 mg | ORAL_TABLET | Freq: Every day | ORAL | 0 refills | Status: AC

## 2017-12-18 MED ORDER — CYCLOBENZAPRINE HCL 5 MG PO TABS: 5.0000 mg | ORAL_TABLET | Freq: Three times a day (TID) | ORAL | 0 refills | Status: DC | PRN

## 2017-12-18 MED ORDER — HYDROCODONE-ACETAMINOPHEN 5-325 MG PO TABS: 1.0000 | ORAL_TABLET | Freq: Three times a day (TID) | ORAL | 0 refills | Status: AC | PRN

## 2017-12-18 NOTE — Discharge Instructions (Signed)
Your exam, labs, and CXR are all consistent with a viral URI/influenza. The rash you developed at the onset of symptoms, and prior to antibiotics, is a common, self-limited rash. It is due to an inflammatory or infectious cause. Take the steroids, nausea medicine, and pain medicine as directed. Rest & hydrate to prevent dehydration. Follow-up with your provider, or return as needed.

## 2017-12-22 ENCOUNTER — Telehealth: Payer: Self-pay | Admitting: Emergency Medicine

## 2017-12-22 NOTE — Telephone Encounter (Signed)
Called patient to assure she will follow up on lab tests returned late--liver function tests.  She says she is aware and will follow up.

## 2018-05-06 ENCOUNTER — Other Ambulatory Visit
Admission: RE | Admit: 2018-05-06 | Discharge: 2018-05-06 | Disposition: A | Discharge status: Home / Self Care | Payer: Medicaid Other | Attending: Ophthalmology | Admitting: Ophthalmology

## 2018-05-06 DIAGNOSIS — H20023 Recurrent acute iridocyclitis, bilateral: Secondary | ICD-10-CM | POA: Diagnosis present

## 2018-05-07 LAB — TOXOPLASMA GONDII ANTIBODY, IGM: Toxoplasma Antibody- IgM: <3 AU/mL (ref 0.0–7.9)

## 2018-05-07 LAB — INFECT DISEASE AB IGM REFLEX 1

## 2018-05-07 LAB — RPR: RPR Ser Ql: NONREACTIVE

## 2018-05-11 LAB — QUANTIFERON-TB GOLD PLUS (RQFGPL)
QUANTIFERON TB1 AG VALUE: 0.13 [IU]/mL
QuantiFERON Mitogen Value: >10 IU/mL
QuantiFERON Nil Value: 0.28 IU/mL
QuantiFERON TB2 Ag Value: 0.13 IU/mL

## 2018-05-11 LAB — QUANTIFERON-TB GOLD PLUS: QuantiFERON-TB Gold Plus: NEGATIVE

## 2018-11-25 ENCOUNTER — Ambulatory Visit (INDEPENDENT_AMBULATORY_CARE_PROVIDER_SITE_OTHER): Payer: Medicaid Other | Admitting: Maternal Newborn

## 2018-11-25 ENCOUNTER — Other Ambulatory Visit (HOSPITAL_COMMUNITY)
Admission: RE | Admit: 2018-11-25 | Discharge: 2018-11-25 | Disposition: A | Discharge status: Home / Self Care | Payer: Medicaid Other | Source: Ambulatory Visit | Attending: Maternal Newborn | Admitting: Maternal Newborn

## 2018-11-25 ENCOUNTER — Encounter: Payer: Self-pay | Admitting: Maternal Newborn

## 2018-11-25 ENCOUNTER — Other Ambulatory Visit: Payer: Self-pay

## 2018-11-25 VITALS — BP 110/80 | Wt 152.6 lb

## 2018-11-25 DIAGNOSIS — Z3A10 10 weeks gestation of pregnancy: Secondary | ICD-10-CM | POA: Diagnosis not present

## 2018-11-25 DIAGNOSIS — Z113 Encounter for screening for infections with a predominantly sexual mode of transmission: Secondary | ICD-10-CM

## 2018-11-25 DIAGNOSIS — Z124 Encounter for screening for malignant neoplasm of cervix: Secondary | ICD-10-CM | POA: Insufficient documentation

## 2018-11-25 DIAGNOSIS — O09521 Supervision of elderly multigravida, first trimester: Secondary | ICD-10-CM | POA: Diagnosis not present

## 2018-11-25 DIAGNOSIS — O0991 Supervision of high risk pregnancy, unspecified, first trimester: Secondary | ICD-10-CM

## 2018-11-25 DIAGNOSIS — Z369 Encounter for antenatal screening, unspecified: Secondary | ICD-10-CM

## 2018-11-25 DIAGNOSIS — O09291 Supervision of pregnancy with other poor reproductive or obstetric history, first trimester: Secondary | ICD-10-CM | POA: Diagnosis not present

## 2018-11-25 DIAGNOSIS — O0990 Supervision of high risk pregnancy, unspecified, unspecified trimester: Secondary | ICD-10-CM

## 2018-11-25 DIAGNOSIS — O09529 Supervision of elderly multigravida, unspecified trimester: Secondary | ICD-10-CM | POA: Insufficient documentation

## 2018-11-25 DIAGNOSIS — O09299 Supervision of pregnancy with other poor reproductive or obstetric history, unspecified trimester: Secondary | ICD-10-CM

## 2018-11-25 LAB — OB RESULTS CONSOLE GC/CHLAMYDIA: Gonorrhea: NEGATIVE

## 2018-11-25 LAB — OB RESULTS CONSOLE VARICELLA ZOSTER ANTIBODY, IGG: Varicella: IMMUNE

## 2018-11-25 NOTE — Patient Instructions (Signed)

## 2018-11-25 NOTE — Progress Notes (Signed)
11/25/2018   Chief Complaint: Desires prenatal care.  Transfer of Care Patient: No  History of Present Illness: Kelly Huber is a 43 y.o. [id number] at 7376w5d based on Patient's last menstrual period on 09/11/2018 (exact date), with an Estimated Date of Delivery: 06/18/2019, with the above CC.   Her periods were: regular periods every 24-26 days, lasting 4-5 days. She was using oral contraceptives (estrogen/progesterone) when she conceived.  She has Negative signs or symptoms of nausea/vomiting of pregnancy. She has Negative signs or symptoms of miscarriage or preterm labor She identifies Negative Zika risk factors for her and her partner On any different medications around the time she conceived/early pregnancy: No  History of varicella: Yes   Review of Systems  Constitutional: Negative.   HENT: Negative.   Eyes: Positive for redness.  Respiratory: Negative for shortness of breath and wheezing.   Cardiovascular: Negative for chest pain and palpitations.  Gastrointestinal: Negative for abdominal pain, nausea and vomiting.  Genitourinary: Negative.   Musculoskeletal: Negative.   Skin: Positive for rash.  Neurological: Negative.   Endo/Heme/Allergies: Positive for environmental allergies.  Psychiatric/Behavioral: The patient has insomnia.    Review of systems was otherwise negative, except as stated in the above HPI.  OBGYN History: As per HPI. OB History  Gravida Para Term Preterm AB Living  9 6 5 1 2 6   SAB TAB Ectopic Multiple Live Births  2     0 6    # Outcome Date GA Lbr Len/2nd Weight Sex Delivery Anes PTL Lv  9 Current           8 Preterm 02/29/16 2172w4d  5 lb 6.1 oz (2.44 kg) M Vag-Spont EPI  LIV  7 Term 05/26/11 1822w0d  7 lb 2 oz (3.232 kg) M Vag-Spont   LIV  6 Term 01/26/04   6 lb (2.722 kg) M Vag-Spont   LIV  5 Term 07/04/98   6 lb (2.722 kg) M Vag-Spont   LIV  4 Term 07/15/96   6 lb (2.722 kg) M Vag-Spont   LIV  3 Term 04/15/95   6 lb (2.722 kg) F Vag-Spont   LIV  2  SAB           1 SAB             Any issues with any prior pregnancies: yes, SAB G6 and G7, preterm delivery due to pre-eclampsia and GDM with G8 Any prior children are healthy, doing well, without any problems or issues: yes History of pap smears: Yes. Last pap smear 2017. NILM History of STIs: No   Past Medical History: Past Medical History:  Diagnosis Date  . Advanced maternal age in multigravida   . Alcohol intoxication with delirium (HCC) 12/28/2016  . Chronic hypertension   . Gestational diabetes   . Gestational hypertension 02/29/2016  . History of postpartum depression   . Marijuana use     Past Surgical History: Past Surgical History:  Procedure Laterality Date  . cataract surgery    . dilation and currette      Family History:  Family History  Problem Relation Age of Onset  . Hypertension Mother   . Bipolar disorder Father   . Schizophrenia Father   . Bipolar disorder Brother        bothers x2   She denies any female cancers, bleeding or blood clotting disorders.  She denies any history of intellectual disability, birth defects or genetic disorders in her or the FOB's history  Social  History:  Social History   Socioeconomic History  . Marital status: Married    Spouse name: Not on file  . Number of children: Not on file  . Years of education: Not on file  . Highest education level: Not on file  Occupational History  . Not on file  Social Needs  . Financial resource strain: Not on file  . Food insecurity    Worry: Not on file    Inability: Not on file  . Transportation needs    Medical: Not on file    Non-medical: Not on file  Tobacco Use  . Smoking status: Former Smoker    Packs/day: 0.25    Years: 20.00    Pack years: 5.00    Types: Cigarettes    Quit date: 12/19/2015    Years since quitting: 2.9  . Smokeless tobacco: Never Used  Substance and Sexual Activity  . Alcohol use: No  . Drug use: Yes    Types: Marijuana    Comment: none in last  month  . Sexual activity: Yes    Partners: Male    Birth control/protection: None  Lifestyle  . Physical activity    Days per week: Not on file    Minutes per session: Not on file  . Stress: Not on file  Relationships  . Social Musicianconnections    Talks on phone: Not on file    Gets together: Not on file    Attends religious service: Not on file    Active member of club or organization: Not on file    Attends meetings of clubs or organizations: Not on file    Relationship status: Not on file  . Intimate partner violence    Fear of current or ex partner: Not on file    Emotionally abused: Not on file    Physically abused: Not on file    Forced sexual activity: Not on file  Other Topics Concern  . Not on file  Social History Narrative  . Not on file   Any cats in the household: no Domestic violence screening is negative.  Allergy: Allergies  Allergen Reactions  . Codeine Hives and Shortness Of Breath  . Penicillins Hives and Shortness Of Breath    Current Outpatient Medications:  Current Outpatient Medications:  .  cetirizine (ZYRTEC) 10 MG tablet, Take 10 mg by mouth daily., Disp: , Rfl:  .  doxylamine, Sleep, (UNISOM) 25 MG tablet, Take 25 mg by mouth at bedtime as needed., Disp: , Rfl:  .  fluticasone (FLONASE) 50 MCG/ACT nasal spray, Place into both nostrils daily., Disp: , Rfl:  .  hydrocortisone cream 1 %, Apply 1 application topically 2 (two) times daily., Disp: , Rfl:  .  LABETALOL HCL PO, Take 100 mg by mouth 2 (two) times daily., Disp: , Rfl:  .  nicotine (NICODERM CQ - DOSED IN MG/24 HOURS) 14 mg/24hr patch, Place 14 mg onto the skin daily., Disp: , Rfl:  .  prenatal vitamin w/FE, FA (PRENATAL 1 + 1) 27-1 MG TABS tablet, Prenatal Plus (calcium carbonate) 27 mg iron-1 mg tablet  TAKE 1 TABLET BY MOUTH DAILY WHILE TRYING TO CONCEIVE, PREGNANT & BREASFEEDING, Disp: , Rfl:    Physical Exam:   BP 110/80   Wt 152 lb 9.6 oz (69.2 kg)   LMP 09/11/2018 (Exact Date)    BMI 26.19 kg/m  Body mass index is 26.19 kg/m. Constitutional: Well nourished, well developed female in no acute distress.  Neck:  Supple,  normal appearance, and no thyromegaly  Cardiovascular: S1, S2 normal, no murmur, rub or gallop, regular rate and rhythm Respiratory:  Clear to auscultation bilaterally. Normal respiratory effort Abdomen: No masses, hernias; diffusely non tender to palpation, non distended Breasts: breasts appear normal, no suspicious masses, no skin or nipple changes or axillary nodes. Neuro/Psych:  Normal mood and affect.  Skin:  Warm and dry.  Lymphatic:  No inguinal lymphadenopathy.   Pelvic exam: is not limited by body habitus External genitalia, Bartholin's glands, Urethra, Skene's glands: within normal limits Vagina: within normal limits and with no blood in the vault  Cervix: normal appearing cervix without discharge or lesions, closed/long/high Uterus:  enlarged, c/w pregnancy Adnexa:  no mass, fullness, tenderness  Assessment: Kelly Huber is a 43 y.o. [id number] at [redacted]w[redacted]d based on Patient's last menstrual period on 09/11/2018 (exact date), with an Estimated Date of Delivery: 06/18/2019, presenting for prenatal care.  Plan:  1) Avoid alcoholic beverages. 2) Patient encouraged not to smoke. Recently quit and has been using nicotine patches successfully.  3) Discontinue the use of all non-medicinal drugs and chemicals. Currently using marijuana and planning to quit. 4) Take prenatal vitamins daily.   5) Seatbelt use advised 6) Nutrition, food safety (fish, cheese advisories, and high nitrite foods) and exercise discussed. 7) Hospital and practice style delivering at [facility] discussed  8) Patient is asked about travel to areas at risk for the [place] virus, and counseled to avoid travel and exposure to mosquitoes or partners who may have themselves been exposed to the virus. 9) Genetic Screening, such as with 1st Trimester Screening, cell free fetal DNA, AFP testing, and  Ultrasound, as well as with amniocentesis and CVS as appropriate, is discussed with patient. She plans to have genetic testing this pregnancy. 10) Dating scan ordered.  Problem list reviewed and updated.  Avel Sensor, CNM [facility], [facility] 11/25/2018  1:48 PM

## 2018-11-25 NOTE — Progress Notes (Signed)
NOB- confirmed at [facility]-no concerns

## 2018-11-27 ENCOUNTER — Telehealth: Payer: Self-pay | Admitting: Obstetrics and Gynecology

## 2018-11-27 LAB — HEMOGLOBIN A1C
Est. average glucose Bld gHb Est-mCnc: 114 mg/dL
Hgb A1c MFr Bld: 5.6 % (ref 4.8–5.6)

## 2018-11-27 LAB — RPR+RH+ABO+RUB AB+AB SCR+CB...
Antibody Screen: NEGATIVE
HIV Screen 4th Generation wRfx: NONREACTIVE
Hematocrit: 39 % (ref 34.0–46.6)
Hemoglobin: 13.5 g/dL (ref 11.1–15.9)
Hepatitis B Surface Ag: NEGATIVE
MCH: 29.5 pg (ref 26.6–33.0)
MCHC: 34.6 g/dL (ref 31.5–35.7)
MCV: 85 fL (ref 79–97)
Platelets: 389 10*3/uL (ref 150–450)
RBC: 4.57 x10E6/uL (ref 3.77–5.28)
RDW: 13.1 % (ref 11.7–15.4)
RPR Ser Ql: NONREACTIVE
Rh Factor: POSITIVE
Rubella Antibodies, IGG: 2.53 index (ref >0.99)
Varicella zoster IgG: 2240 index (ref >165)
WBC: 9.4 10*3/uL (ref 3.4–10.8)

## 2018-11-27 LAB — URINE CULTURE

## 2018-11-27 LAB — HEMOGLOBINOPATHY EVALUATION
HGB C: 0 %
HGB S: 0 %
HGB VARIANT: 0 %
Hemoglobin A2 Quantitation: 2.4 % (ref 1.8–3.2)
Hemoglobin F Quantitation: 0 % (ref 0.0–2.0)
Hgb A: 97.6 % (ref 96.4–98.8)

## 2018-11-27 NOTE — Telephone Encounter (Signed)
Angie from [facility] called to get assistance for patient information.

## 2018-11-30 LAB — URINE DRUG PANEL 7
Amphetamines, Urine: NEGATIVE ng/mL
Barbiturate Quant, Ur: NEGATIVE ng/mL
Benzodiazepine Quant, Ur: NEGATIVE ng/mL
Cannabinoid Quant, Ur: POSITIVE — AB
Cocaine (Metab.): NEGATIVE ng/mL
Opiate Quant, Ur: NEGATIVE ng/mL
PCP Quant, Ur: NEGATIVE ng/mL

## 2018-11-30 LAB — CYTOLOGY - PAP
Chlamydia: NEGATIVE
Diagnosis: NEGATIVE
HPV: NOT DETECTED
Neisseria Gonorrhea: NEGATIVE

## 2018-12-01 ENCOUNTER — Other Ambulatory Visit: Payer: Self-pay | Admitting: Maternal Newborn

## 2018-12-01 DIAGNOSIS — B373 Candidiasis of vulva and vagina: Secondary | ICD-10-CM

## 2018-12-01 DIAGNOSIS — B3731 Acute candidiasis of vulva and vagina: Secondary | ICD-10-CM

## 2018-12-01 MED ORDER — TERCONAZOLE 0.4 % VA CREA: 1.0000 | TOPICAL_CREAM | Freq: Every day | VAGINAL | 0 refills | Status: AC

## 2018-12-01 NOTE — Progress Notes (Signed)
Rx for Terazol for yeast infection. 

## 2018-12-01 NOTE — Addendum Note (Signed)
Addended by: Avel Sensor on: 12/01/2018 07:56 AM   Modules accepted: Orders

## 2018-12-03 ENCOUNTER — Other Ambulatory Visit: Payer: Self-pay

## 2018-12-03 ENCOUNTER — Ambulatory Visit (INDEPENDENT_AMBULATORY_CARE_PROVIDER_SITE_OTHER): Payer: Medicaid Other | Admitting: Advanced Practice Midwife

## 2018-12-03 ENCOUNTER — Ambulatory Visit (INDEPENDENT_AMBULATORY_CARE_PROVIDER_SITE_OTHER): Payer: Medicaid Other

## 2018-12-03 ENCOUNTER — Encounter: Payer: Self-pay | Admitting: Advanced Practice Midwife

## 2018-12-03 VITALS — BP 114/78 | Wt 154.0 lb

## 2018-12-03 DIAGNOSIS — O0990 Supervision of high risk pregnancy, unspecified, unspecified trimester: Secondary | ICD-10-CM

## 2018-12-03 DIAGNOSIS — Z3A08 8 weeks gestation of pregnancy: Secondary | ICD-10-CM

## 2018-12-03 DIAGNOSIS — O10911 Unspecified pre-existing hypertension complicating pregnancy, first trimester: Secondary | ICD-10-CM

## 2018-12-03 DIAGNOSIS — O09521 Supervision of elderly multigravida, first trimester: Secondary | ICD-10-CM

## 2018-12-03 DIAGNOSIS — Z3687 Encounter for antenatal screening for uncertain dates: Secondary | ICD-10-CM | POA: Diagnosis not present

## 2018-12-03 DIAGNOSIS — O09291 Supervision of pregnancy with other poor reproductive or obstetric history, first trimester: Secondary | ICD-10-CM

## 2018-12-03 DIAGNOSIS — Z369 Encounter for antenatal screening, unspecified: Secondary | ICD-10-CM

## 2018-12-03 MED ORDER — FUSION 65-65-25-30 MG PO CAPS: 1.0000 | ORAL_CAPSULE | Freq: Every day | ORAL | 11 refills | Status: DC

## 2018-12-03 NOTE — Progress Notes (Signed)
  Routine Prenatal Care Visit  Subjective  Kelly Huber is a 43 y.o. [id number] at [redacted]w[redacted]d by ultrasound today being seen today for ongoing prenatal care.  She is currently monitored for the following issues for this high-risk pregnancy and has Marijuana abuse; HTN (hypertension); Supervision of high risk pregnancy, antepartum; Hx of preeclampsia, prior pregnancy, currently pregnant; and Antepartum multigravida of advanced maternal age on their problem list.  ----------------------------------------------------------------------------------- Patient reports difficulty sleeping.    . Vag. Bleeding: None.   . Denies leaking of fluid.  ----------------------------------------------------------------------------------- The following portions of the patient's history were reviewed and updated as appropriate: allergies, current medications, past family history, past medical history, past social history, past surgical history and problem list. Problem list updated.   Objective  Blood pressure 114/78, weight 154 lb (69.9 kg), last menstrual period 09/11/2018 Pregravid weight 158 lb (71.7 kg) Total Weight Gain -4 lb (-1.814 kg) Urinalysis: Urine Protein    Urine Glucose    Fetal Status: Fetal Heart Rate (bpm): 176         Dating scan today: 8 weeks 3 days, EDD adjusted  General:  Alert, oriented and cooperative. Patient is in no acute distress.  Skin: Skin is warm and dry. No rash noted.   Cardiovascular: Normal heart rate noted  Respiratory: Normal respiratory effort, no problems with respiration noted  Abdomen: Soft, gravid, appropriate for gestational age. Pain/Pressure: Absent     Pelvic:  Cervical exam deferred        Extremities: Normal range of motion.     Mental Status: Normal mood and affect. Normal behavior. Normal judgment and thought content.   Assessment   43 y.o. [id number] at 8 weeks 3 days by ultrasound today presenting for routine prenatal visit  Plan   NINTH Problems (from  11/25/18 to present)    Problem Noted Resolved   Supervision of high risk pregnancy, antepartum 11/25/2018 by Rexene Agent, CNM No   Overview Signed 11/25/2018 10:27 AM by Rexene Agent, [place] Prenatal Labs  Dating  Blood type:     Genetic Screen 1 Screen:    AFP:     Quad:     NIPS: Antibody:   Anatomic [place]  Rubella:   Varicella:    GTT Early:               Third trimester:  RPR: Non Reactive (12/18 1001)   Rhogam  HBsAg:     TDaP vaccine                       Flu Shot: HIV:     Baby Food                                GBS:   Contraception  Pap:  CBB     CS/VBAC    Support Person                  Preterm labor symptoms and general obstetric precautions including but not limited to vaginal bleeding, contractions, leaking of fluid and fetal movement were reviewed in detail with the patient. Please refer to After Visit Summary for other counseling recommendations.   Return in about 2 weeks (around 12/17/2018) for rob.  Rod Can, CNM 12/03/2018 12:23 PM

## 2018-12-03 NOTE — Progress Notes (Signed)
U/s today. Pt c/o not being able to sleep.

## 2018-12-17 ENCOUNTER — Ambulatory Visit (INDEPENDENT_AMBULATORY_CARE_PROVIDER_SITE_OTHER): Payer: Medicaid Other | Admitting: Obstetrics and Gynecology

## 2018-12-17 ENCOUNTER — Other Ambulatory Visit: Payer: Self-pay

## 2018-12-17 ENCOUNTER — Encounter: Payer: Self-pay | Admitting: Obstetrics and Gynecology

## 2018-12-17 VITALS — BP 124/82 | Wt 155.0 lb

## 2018-12-17 DIAGNOSIS — O0990 Supervision of high risk pregnancy, unspecified, unspecified trimester: Secondary | ICD-10-CM

## 2018-12-17 DIAGNOSIS — O09291 Supervision of pregnancy with other poor reproductive or obstetric history, first trimester: Secondary | ICD-10-CM

## 2018-12-17 DIAGNOSIS — O09521 Supervision of elderly multigravida, first trimester: Secondary | ICD-10-CM

## 2018-12-17 DIAGNOSIS — Z1379 Encounter for other screening for genetic and chromosomal anomalies: Secondary | ICD-10-CM

## 2018-12-17 DIAGNOSIS — O10911 Unspecified pre-existing hypertension complicating pregnancy, first trimester: Secondary | ICD-10-CM

## 2018-12-17 DIAGNOSIS — O09529 Supervision of elderly multigravida, unspecified trimester: Secondary | ICD-10-CM

## 2018-12-17 DIAGNOSIS — Z3A10 10 weeks gestation of pregnancy: Secondary | ICD-10-CM

## 2018-12-17 DIAGNOSIS — I10 Essential (primary) hypertension: Secondary | ICD-10-CM

## 2018-12-17 DIAGNOSIS — O09299 Supervision of pregnancy with other poor reproductive or obstetric history, unspecified trimester: Secondary | ICD-10-CM

## 2018-12-17 DIAGNOSIS — O0991 Supervision of high risk pregnancy, unspecified, first trimester: Secondary | ICD-10-CM

## 2018-12-17 MED ORDER — VITAFOL FE+ 90-0.6-0.4-200 MG PO CAPS: 1.0000 | ORAL_CAPSULE | Freq: Every day | ORAL | 11 refills | Status: DC

## 2018-12-17 NOTE — Progress Notes (Signed)
    Routine Prenatal Care Visit  Subjective  Kelly Huber is a 43 y.o. [id number] at [redacted]w[redacted]d being seen today for ongoing prenatal care.  She is currently monitored for the following issues for this high-risk pregnancy and has Marijuana abuse; HTN (hypertension); Supervision of high risk pregnancy, antepartum; Hx of preeclampsia, prior pregnancy, currently pregnant; and Antepartum multigravida of advanced maternal age on their problem list.  ----------------------------------------------------------------------------------- Patient reports no complaints.   Contractions: Not present. Vag. Bleeding: None.  Movement: Present. Denies leaking of fluid.  ----------------------------------------------------------------------------------- The following portions of the patient's history were reviewed and updated as appropriate: allergies, current medications, past family history, past medical history, past social history, past surgical history and problem list. Problem list updated.   Objective  Blood pressure 124/82, weight 155 lb (70.3 kg), last menstrual period 09/11/2018, unknown if currently breastfeeding. Pregravid weight 158 lb (71.7 kg) Total Weight Gain -3 lb (-1.361 kg) Urinalysis:      Fetal Status: Fetal Heart Rate (bpm): 177   Movement: Present     General:  Alert, oriented and cooperative. Patient is in no acute distress.  Skin: Skin is warm and dry. No rash noted.   Cardiovascular: Normal heart rate noted  Respiratory: Normal respiratory effort, no problems with respiration noted  Abdomen: Soft, gravid, appropriate for gestational age. Pain/Pressure: Absent     Pelvic:  Cervical exam deferred        Extremities: Normal range of motion.     Mental Status: Normal mood and affect. Normal behavior. Normal judgment and thought content.     Assessment   43 y.o. [id number] at [redacted]w[redacted]d by  07/12/2019, by Ultrasound presenting for routine prenatal visit  Plan   NINTH Problems (from 11/25/18  to present)    Problem Noted Resolved   Supervision of high risk pregnancy, antepartum 11/25/2018 by Rexene Agent, CNM No   Overview Addendum 12/17/2018 10:42 AM by Homero Fellers, MD    Clinic [facility]  Dating 8wk [place] Blood type: O/Positive/-- (07/08 1051)   Genetic Screen 1 Screen:    AFP:     Quad:     NIPS: Antibody:Negative (07/08 1051)  Anatomic [place]  Rubella: 2.53 (07/08 1051) Varicella:  Immune  GTT Early:               Third trimester:  RPR: Non Reactive (07/08 1051)   Rhogam Not needed HBsAg: Negative (07/08 1051)   TDaP vaccine                       Flu Shot: HIV: Non Reactive (07/08 1051)   Baby Food                                GBS:   Contraception  Pap: NIL 2020  CBB     CS/VBAC    Support Person                  Gestational age appropriate obstetric precautions including but not limited to vaginal bleeding, contractions, leaking of fluid and fetal movement were reviewed in detail with the patient.    NIPT today Will collect 24 urine sample for baseline Consider 1GTT at 24 weeks.   Return in about 2 weeks (around 12/31/2018) for ROB in person.  Homero Fellers MD [facility], [facility] 12/17/2018, 11:03 AM

## 2018-12-17 NOTE — Progress Notes (Signed)
ROB No concerns 

## 2018-12-20 ENCOUNTER — Other Ambulatory Visit: Payer: Self-pay | Admitting: Obstetrics and Gynecology

## 2018-12-22 LAB — MATERNIT21 PLUS CORE+SCA
Fetal Fraction: 8
Monosomy X (Turner Syndrome): NOT DETECTED
Result (T21): NEGATIVE
Trisomy 13 (Patau syndrome): NEGATIVE
Trisomy 18 (Edwards syndrome): NEGATIVE
Trisomy 21 (Down syndrome): NEGATIVE
XXX (Triple X Syndrome): NOT DETECTED
XXY (Klinefelter Syndrome): NOT DETECTED
XYY (Jacobs Syndrome): NOT DETECTED

## 2018-12-22 LAB — CREATININE CLEARANCE, URINE, 24 HOUR
Creatinine Clearance: 112 mL/min (ref 88–128)
Creatinine, 24H Ur: 1418 mg/24 hr (ref 800–1800)
Creatinine, Ser: 0.88 mg/dL (ref 0.57–1.00)
Creatinine, Urine: 202.6 mg/dL
GFR calc Af Amer: 95 mL/min/{1.73_m2} (ref >59)
GFR calc non Af Amer: 82 mL/min/{1.73_m2} (ref >59)

## 2018-12-22 LAB — PROTEIN, URINE, 24 HOUR
Protein, 24H Urine: 110 mg/24 hr (ref 30–150)
Protein, Ur: 15.7 mg/dL

## 2018-12-22 NOTE — Progress Notes (Signed)
Normal XY

## 2018-12-31 ENCOUNTER — Other Ambulatory Visit: Payer: Self-pay

## 2018-12-31 ENCOUNTER — Encounter: Payer: Self-pay | Admitting: Obstetrics and Gynecology

## 2018-12-31 ENCOUNTER — Ambulatory Visit (INDEPENDENT_AMBULATORY_CARE_PROVIDER_SITE_OTHER): Payer: Medicaid Other | Admitting: Obstetrics and Gynecology

## 2018-12-31 VITALS — BP 122/78 | Wt 158.0 lb

## 2018-12-31 DIAGNOSIS — O09299 Supervision of pregnancy with other poor reproductive or obstetric history, unspecified trimester: Secondary | ICD-10-CM

## 2018-12-31 DIAGNOSIS — O10911 Unspecified pre-existing hypertension complicating pregnancy, first trimester: Secondary | ICD-10-CM

## 2018-12-31 DIAGNOSIS — O0990 Supervision of high risk pregnancy, unspecified, unspecified trimester: Secondary | ICD-10-CM

## 2018-12-31 DIAGNOSIS — Z3A12 12 weeks gestation of pregnancy: Secondary | ICD-10-CM

## 2018-12-31 DIAGNOSIS — I10 Essential (primary) hypertension: Secondary | ICD-10-CM

## 2018-12-31 DIAGNOSIS — O09521 Supervision of elderly multigravida, first trimester: Secondary | ICD-10-CM

## 2018-12-31 DIAGNOSIS — O09529 Supervision of elderly multigravida, unspecified trimester: Secondary | ICD-10-CM

## 2018-12-31 DIAGNOSIS — O09291 Supervision of pregnancy with other poor reproductive or obstetric history, first trimester: Secondary | ICD-10-CM

## 2018-12-31 DIAGNOSIS — O0991 Supervision of high risk pregnancy, unspecified, first trimester: Secondary | ICD-10-CM

## 2018-12-31 NOTE — Progress Notes (Signed)
Routine Prenatal Care Visit  Subjective  Katheen Huber is a 43 y.o. [id number] at [redacted]w[redacted]d being seen today for ongoing prenatal care.  She is currently monitored for the following issues for this high-risk pregnancy and has Marijuana abuse; HTN (hypertension); Supervision of high risk pregnancy, antepartum; Hx of preeclampsia, prior pregnancy, currently pregnant; and Antepartum multigravida of advanced maternal age on their problem list.  ----------------------------------------------------------------------------------- Patient reports no complaints.   Contractions: Not present. Vag. Bleeding: None.  Movement: Absent. Denies leaking of fluid.  ----------------------------------------------------------------------------------- The following portions of the patient's history were reviewed and updated as appropriate: allergies, current medications, past family history, past medical history, past social history, past surgical history and problem list. Problem list updated.   Objective  Blood pressure 122/78, weight 158 lb (71.7 kg), last menstrual period 09/11/2018, unknown if currently breastfeeding. Pregravid weight 158 lb (71.7 kg) Total Weight Gain 0 lb (0 kg) Urinalysis: Urine Protein    Urine Glucose    Fetal Status: Fetal Heart Rate (bpm): 155   Movement: Absent     General:  Alert, oriented and cooperative. Patient is in no acute distress.  Skin: Skin is warm and dry. No rash noted.   Cardiovascular: Normal heart rate noted  Respiratory: Normal respiratory effort, no problems with respiration noted  Abdomen: Soft, gravid, appropriate for gestational age. Pain/Pressure: Absent     Pelvic:  Cervical exam deferred        Extremities: Normal range of motion.  Edema: None  Mental Status: Normal mood and affect. Normal behavior. Normal judgment and thought content.   Assessment   43 y.o. L9F7902 at [redacted]w[redacted]d by  07/12/2019, by Ultrasound presenting for routine prenatal visit  Plan   NINTH  Problems (from 11/25/18 to present)    Problem Noted Resolved   Supervision of high risk pregnancy, antepartum 11/25/2018 by Rexene Agent, CNM No   Overview Addendum 12/31/2018 11:52 AM by Will Bonnet, MD    [facility]inic [facility]  Dating 8wk [place] Blood type: O/Positive/-- (07/08 1051)   Genetic Screen AFP:     NIPS: diploid XY Antibody:Negative (07/08 1051)  Anatomic [place]  Rubella: 2.53 (07/08 1051) Varicella:  Immune  GTT Early:               Third trimester:  RPR: Non Reactive (07/08 1051)   Rhogam Not needed HBsAg: Negative (07/08 1051)   TDaP vaccine                       Flu Shot: HIV: Non Reactive (07/08 1051)   Baby Food                                GBS:   Contraception  Pap: NIL 2020  CBB     CS/VBAC    Support Person                  Preterm labor symptoms and general obstetric precautions including but not limited to vaginal bleeding, contractions, leaking of fluid and fetal movement were reviewed in detail with the patient. Please refer to After Visit Summary for other counseling recommendations.   - Start baby Aspirin 81 mg.   - monitor BP at home - reviewed labs and precautions and usual course of pregnancy. - samples of Citrinatal harmony given today (has stool softner)  Return in about 4 weeks (around 01/28/2019) for Routine Prenatal Appointment.  Thomasene MohairStephen Shemiah Rosch, MD, Merlinda FrederickFACOG [facility], [facility] 12/31/2018 12:18 PM

## 2019-01-28 ENCOUNTER — Ambulatory Visit (INDEPENDENT_AMBULATORY_CARE_PROVIDER_SITE_OTHER): Payer: Medicaid Other | Admitting: Advanced Practice Midwife

## 2019-01-28 ENCOUNTER — Other Ambulatory Visit: Payer: Self-pay

## 2019-01-28 ENCOUNTER — Encounter: Payer: Self-pay | Admitting: Advanced Practice Midwife

## 2019-01-28 VITALS — BP 118/78 | Wt 163.0 lb

## 2019-01-28 DIAGNOSIS — L309 Dermatitis, unspecified: Secondary | ICD-10-CM

## 2019-01-28 DIAGNOSIS — O0990 Supervision of high risk pregnancy, unspecified, unspecified trimester: Secondary | ICD-10-CM

## 2019-01-28 DIAGNOSIS — Z3A16 16 weeks gestation of pregnancy: Secondary | ICD-10-CM

## 2019-01-28 DIAGNOSIS — O0992 Supervision of high risk pregnancy, unspecified, second trimester: Secondary | ICD-10-CM

## 2019-01-28 MED ORDER — TRIAMCINOLONE ACETONIDE 0.5 % EX CREA: 1.0000 "application " | TOPICAL_CREAM | Freq: Three times a day (TID) | CUTANEOUS | 0 refills | Status: DC

## 2019-01-28 NOTE — Progress Notes (Signed)
Pt c/o some constipation. No vb. No lof.

## 2019-01-28 NOTE — Patient Instructions (Signed)

## 2019-01-28 NOTE — Progress Notes (Signed)
Routine Prenatal Care Visit  Subjective  Kelly Huber is a 43 y.o. [id number] at 2663w3d being seen today for ongoing prenatal care.  She is currently monitored for the following issues for this high-risk pregnancy and has Marijuana abuse; HTN (hypertension); Supervision of high risk pregnancy, antepartum; Hx of preeclampsia, prior pregnancy, currently pregnant; Antepartum multigravida of advanced maternal age; and Lumbar radiculopathy on their problem list.  ----------------------------------------------------------------------------------- Patient reports eczema not relieved by 0.1% Kenalog. She is requesting 0.5% Rx. She reports constipation with possible hemorrhoid from straining. She prefers to have every 2 week visits due to her high risk pregnancy. Reminded her to take baby ASA daily.  For the past 5 weeks she has been taking Labetalol 1 time per day due to low BP on twice daily dosing. Contractions: Not present. Vag. Bleeding: None.  Movement: Present. Leaking Fluid denies.  ----------------------------------------------------------------------------------- The following portions of the patient's history were reviewed and updated as appropriate: allergies, current medications, past family history, past medical history, past social history, past surgical history and problem list. Problem list updated.  Objective  Blood pressure 118/78, weight 163 lb (73.9 kg), last menstrual period 09/11/2018 Pregravid weight 158 lb (71.7 kg) Total Weight Gain 5 lb (2.268 kg) Urinalysis: Urine Protein    Urine Glucose    Fetal Status: Fetal Heart Rate (bpm): 145   Movement: Present     General:  Alert, oriented and cooperative. Patient is in no acute distress.  Skin: Skin is warm and dry. No rash noted.   Cardiovascular: Normal heart rate noted  Respiratory: Normal respiratory effort, no problems with respiration noted  Abdomen: Soft, gravid, appropriate for gestational age. Pain/Pressure: Absent      Pelvic:  Cervical exam deferred        Extremities: Normal range of motion.  Edema: None  Mental Status: Normal mood and affect. Normal behavior. Normal judgment and thought content.   Assessment   43 y.o. [id number] at 8663w3d by  07/12/2019, by Ultrasound presenting for routine prenatal visit  Plan   NINTH Problems (from 11/25/18 to present)    Problem Noted Resolved   Supervision of high risk pregnancy, antepartum 11/25/2018 by Oswaldo ConroySchmid, Jacelyn Y, CNM No   Overview Addendum 12/31/2018 11:52 AM by Conard NovakJackson, Stephen D, MD    [facility]inic [facility]  Dating 8wk US Blood type: O/Positive/-- (07/08 1051)   Genetic Screen AFP:     NIPS: diploid XY Antibody:Negative (07/08 1051)  Anatomic US  Rubella: 2.53 (07/08 1051) Varicella:  Immune  GTT Early:               Third trimester:  RPR: Non Reactive (07/08 1051)   Rhogam Not needed HBsAg: Negative (07/08 1051)   TDaP vaccine                       Flu Shot: HIV: Non Reactive (07/08 1051)   Baby Food                                GBS:   Contraception  Pap: NIL 2020  CBB     CS/VBAC    Support Person                  Preterm labor symptoms and general obstetric precautions including but not limited to vaginal bleeding, contractions, leaking of fluid and fetal movement were reviewed in detail with the patient. Please refer to  After Visit Summary for other counseling recommendations.  Hx Preeclampsia: take baby ASA daily til 36 weeks, no smoking, stay hydrated, eat healthy including adequate protein, stay active, take BP medicine  Return in about 2 weeks (around 02/11/2019) for anatomy scan and rob.  Rod Can, CNM 01/28/2019 10:54 AM

## 2019-02-12 ENCOUNTER — Other Ambulatory Visit: Payer: Self-pay

## 2019-02-12 ENCOUNTER — Ambulatory Visit (INDEPENDENT_AMBULATORY_CARE_PROVIDER_SITE_OTHER): Payer: Medicaid Other | Admitting: Obstetrics and Gynecology

## 2019-02-12 ENCOUNTER — Ambulatory Visit (INDEPENDENT_AMBULATORY_CARE_PROVIDER_SITE_OTHER): Payer: Medicaid Other

## 2019-02-12 VITALS — BP 134/70 | Wt 165.0 lb

## 2019-02-12 DIAGNOSIS — O162 Unspecified maternal hypertension, second trimester: Secondary | ICD-10-CM

## 2019-02-12 DIAGNOSIS — O0992 Supervision of high risk pregnancy, unspecified, second trimester: Secondary | ICD-10-CM

## 2019-02-12 DIAGNOSIS — O0942 Supervision of pregnancy with grand multiparity, second trimester: Secondary | ICD-10-CM

## 2019-02-12 DIAGNOSIS — O9989 Other specified diseases and conditions complicating pregnancy, childbirth and the puerperium: Secondary | ICD-10-CM

## 2019-02-12 DIAGNOSIS — O0940 Supervision of pregnancy with grand multiparity, unspecified trimester: Secondary | ICD-10-CM

## 2019-02-12 DIAGNOSIS — O09529 Supervision of elderly multigravida, unspecified trimester: Secondary | ICD-10-CM

## 2019-02-12 DIAGNOSIS — O0990 Supervision of high risk pregnancy, unspecified, unspecified trimester: Secondary | ICD-10-CM

## 2019-02-12 DIAGNOSIS — O09522 Supervision of elderly multigravida, second trimester: Secondary | ICD-10-CM

## 2019-02-12 DIAGNOSIS — Z8659 Personal history of other mental and behavioral disorders: Secondary | ICD-10-CM | POA: Insufficient documentation

## 2019-02-12 DIAGNOSIS — Z3A18 18 weeks gestation of pregnancy: Secondary | ICD-10-CM

## 2019-02-12 DIAGNOSIS — Z363 Encounter for antenatal screening for malformations: Secondary | ICD-10-CM | POA: Diagnosis not present

## 2019-02-12 DIAGNOSIS — O169 Unspecified maternal hypertension, unspecified trimester: Secondary | ICD-10-CM

## 2019-02-12 LAB — POCT URINALYSIS DIPSTICK OB
Glucose, UA: NEGATIVE
POC,PROTEIN,UA: NEGATIVE

## 2019-02-12 NOTE — Progress Notes (Signed)
Routine Prenatal Care Visit  Subjective  Kelly Huber is a 43 y.o. [id number] at [redacted]w[redacted]d being seen today for ongoing prenatal care.  She is currently monitored for the following issues for this high-risk pregnancy and has Marijuana abuse; HTN (hypertension); Supervision of high risk pregnancy, antepartum; Hx of preeclampsia, prior pregnancy, currently pregnant; Antepartum multigravida of advanced maternal age; Lumbar radiculopathy; Grand multiparity with antenatal problem, antepartum; and Hypertension affecting pregnancy, antepartum on their problem list.  ----------------------------------------------------------------------------------- Patient reports no complaints.   Contractions: Not present. Vag. Bleeding: None.  Movement: Present. Denies leaking of fluid.  ----------------------------------------------------------------------------------- The following portions of the patient's history were reviewed and updated as appropriate: allergies, current medications, past family history, past medical history, past social history, past surgical history and problem list. Problem list updated.   Objective  Blood pressure 134/70, weight 165 lb (74.8 kg), last menstrual period 09/11/2018, unknown if currently breastfeeding. Pregravid weight 158 lb (71.7 kg) Total Weight Gain 7 lb (3.175 kg) Urinalysis:      Fetal Status: Fetal Heart Rate (bpm): 140   Movement: Present     General:  Alert, oriented and cooperative. Patient is in no acute distress.  Skin: Skin is warm and dry. No rash noted.   Cardiovascular: Normal heart rate noted  Respiratory: Normal respiratory effort, no problems with respiration noted  Abdomen: Soft, gravid, appropriate for gestational age. Pain/Pressure: Absent     Pelvic:  Cervical exam deferred        Extremities: Normal range of motion.     ental Status: Normal mood and affect. Normal behavior. Normal judgment and thought content.   US Ob Comp + 14 Wk  Result Date:  02/12/2019 Patient Name: Kelly Huber DOB: [birth year] MRN: [id number] ULTRASOUND REPORT Location: [facility] Date of Service: 02/12/2019 Indications:Anatomy Ultrasound Findings: Nelda [place] intrauterine pregnancy is visualized with FHR at 147 BPM. Biometrics give an (U/S) Gestational age of [redacted]w[redacted]d and an (U/S) EDD of 07/12/2019; this correlates with the clinically established Estimated Date of Delivery: 07/12/19 Fetal presentation is Breech. EFW: 252 g ( 9 oz ). Placenta: anterior. Grade: 0 AFI: subjectively normal. Anatomic survey is complete and normal; Gender - female.  Impression: 1. [redacted]w[redacted]d Viable Singleton Intrauterine pregnancy by U/S. 2. (U/S) EDD is consistent with Clinically established Estimated Date of Delivery: 07/12/19 . 3. Normal Anatomy Scan Recommendations: 1.Clinical correlation with the patient's History and Physical Exam. Gweneth Dimitri, RT  There is a singleton gestation with subjectively normal amniotic fluid volume. The fetal biometry correlates with established dating. Detailed evaluation of the fetal anatomy was performed.The fetal anatomical survey appears within normal limits within the resolution of ultrasound as described above.  It must be noted that a normal ultrasound is unable to rule out fetal aneuploidy, subtle defects such as small ASD or VDS may also not be visible on imaging.  Malachy Mood, MD, [facility], [facility] 02/12/2019, 4:45 PM     Assessment   43 y.o. P6P9509 at [redacted]w[redacted]d by  07/12/2019, by Ultrasound presenting for routine prenatal visit  Plan   NINTH Problems (from 11/25/18 to present)    Problem Noted Resolved   [place] multiparity with antenatal problem, antepartum 02/12/2019 by Malachy Mood, MD No   Overview Signed 02/12/2019  5:14 PM by Malachy Mood, MD    Increased risk of uterine atony and postpartum hemorrhage      Hypertension affecting pregnancy, antepartum 02/12/2019 by Malachy Mood, MD No   Overview Signed  02/12/2019  5:17  PM by Vena Austria, MD    [X]  Aspirin 81 mg daily after 12 weeks; discontinue after 36 weeks [X]  baseline labs with CBC, CMP, urine protein/creatinine ratio [X]  no BP meds unless BPs become elevated - on labetalol 100mg  bid [ ]  ultrasound for growth at 28, 32, 36 weeks  Current antihypertensives:  Labetalol   Baseline and surveillance labs (pulled in from [facility], refresh links as needed)  Lab Results  Component Value Date   PLT 389 11/25/2018   CREATININE 0.88 12/20/2018   AST 54 (H) 12/17/2017   ALT 53 (H) 12/17/2017   PROTCRRATIO 0.29 (H) 02/28/2016   LABPROT 11.8 12/28/2016   PROTEIN24HR 110 12/20/2018    Antenatal Testing CHTN - O10.919  Group I  BP < 140/90, no preeclampsia, AGA,  nml AFV, +/- meds    Group II BP > 140/90, on meds, no preeclampsia, AGA, nml AFV  20-28-34-38  20-24-28-32-35-38  32//2 x wk  28//BPP wkly then 32//2 x wk  40 no meds; 39 meds  PRN or 37  Pre-eclampsia  GHTN - O13.9/Preeclampsia without severe features  - O14.00   Preeclampsia with severe features - O14.10  Q 3-4wks  Q 2 wks  28//BPP wkly then 32//2 x wk  Inpatient  37  PRN or 34         History of postpartum depression, currently pregnant 02/12/2019 by 02/19/2019, MD No   Supervision of high risk pregnancy, antepartum 11/25/2018 by 02-18-1969, CNM No   Overview Addendum 02/12/2019  5:13 PM by 02/14/2019, MD    Clinic [facility] Prenatal Labs  Dating 8wk Vena Austria Blood type: O/Positive/-- (07/08 1051)   Genetic Screen AFP:     NIPS: diploid XY Antibody:Negative (07/08 1051)  Anatomic 02/14/2019 Normal Rubella: 2.53 (07/08 1051) Varicella:  Immune  GTT Third trimester:  RPR: Non Reactive (07/08 1051)   Rhogam Not needed HBsAg: Negative (07/08 1051)   TDaP vaccine                       Flu Shot: HIV: Non Reactive (07/08 1051)   Baby Food                                GBS:   Contraception  Pap: NIL 2020  CBB   Pelvis tested to 7lbs 2oz  CS/VBAC N/A    Support Person Ahmad             Hx of preeclampsia, prior pregnancy, currently pregnant 11/25/2018 by 12-13-1982, CNM No      Gestational age appropriate obstetric precautions including but not limited to vaginal bleeding, contractions, leaking of fluid and fetal movement were reviewed in detail with the patient.    - normal anatomy scan today - would like to keep visits two weeks given [facility] and history of superimposed pre-eclampsia - is taking ASA daily - doing well as far as moods  Return in about 2 weeks (around 02/26/2019) for ROB.  12-13-1982, MD, 01/26/2019 OB/GYN, [facility] 02/12/2019, 5:19 PM

## 2019-02-12 NOTE — Progress Notes (Signed)
Anatomy scan/ It is a BOY!

## 2019-03-02 ENCOUNTER — Other Ambulatory Visit: Payer: Self-pay

## 2019-03-02 ENCOUNTER — Ambulatory Visit (INDEPENDENT_AMBULATORY_CARE_PROVIDER_SITE_OTHER): Payer: Medicaid Other | Admitting: Obstetrics and Gynecology

## 2019-03-02 VITALS — BP 130/80 | Wt 170.0 lb

## 2019-03-02 DIAGNOSIS — O99892 Other specified diseases and conditions complicating childbirth: Secondary | ICD-10-CM

## 2019-03-02 DIAGNOSIS — I10 Essential (primary) hypertension: Secondary | ICD-10-CM

## 2019-03-02 DIAGNOSIS — O0940 Supervision of pregnancy with grand multiparity, unspecified trimester: Secondary | ICD-10-CM

## 2019-03-02 DIAGNOSIS — O99891 Other specified diseases and conditions complicating pregnancy: Secondary | ICD-10-CM

## 2019-03-02 DIAGNOSIS — Z3A21 21 weeks gestation of pregnancy: Secondary | ICD-10-CM

## 2019-03-02 DIAGNOSIS — Z8659 Personal history of other mental and behavioral disorders: Secondary | ICD-10-CM

## 2019-03-02 DIAGNOSIS — O0942 Supervision of pregnancy with grand multiparity, second trimester: Secondary | ICD-10-CM

## 2019-03-02 DIAGNOSIS — O0990 Supervision of high risk pregnancy, unspecified, unspecified trimester: Secondary | ICD-10-CM

## 2019-03-02 DIAGNOSIS — O09292 Supervision of pregnancy with other poor reproductive or obstetric history, second trimester: Secondary | ICD-10-CM

## 2019-03-02 DIAGNOSIS — O09299 Supervision of pregnancy with other poor reproductive or obstetric history, unspecified trimester: Secondary | ICD-10-CM

## 2019-03-02 DIAGNOSIS — O0992 Supervision of high risk pregnancy, unspecified, second trimester: Secondary | ICD-10-CM

## 2019-03-02 DIAGNOSIS — O09522 Supervision of elderly multigravida, second trimester: Secondary | ICD-10-CM

## 2019-03-02 DIAGNOSIS — O09529 Supervision of elderly multigravida, unspecified trimester: Secondary | ICD-10-CM

## 2019-03-02 MED ORDER — FLUCONAZOLE 150 MG PO TABS: 150.0000 mg | ORAL_TABLET | Freq: Once | ORAL | 0 refills | Status: AC

## 2019-03-02 MED ORDER — OMEPRAZOLE 20 MG PO CPDR: 20.0000 mg | DELAYED_RELEASE_CAPSULE | Freq: Every day | ORAL | 11 refills | Status: AC

## 2019-03-02 MED ORDER — HYDROCORTISONE (PERIANAL) 2.5 % EX CREA: 1.0000 "application " | TOPICAL_CREAM | Freq: Two times a day (BID) | CUTANEOUS | 0 refills | Status: AC

## 2019-03-02 NOTE — Progress Notes (Signed)
ROB C/o some pelvic pain and pressure  Denies lof, no vb, Good FM

## 2019-03-02 NOTE — Progress Notes (Signed)
Routine Prenatal Care Visit  Subjective  Kelly Huber is a 43 y.o. Q6P6195 at [redacted]w[redacted]d being seen today for ongoing prenatal care.  She is currently monitored for the following issues for this high-risk pregnancy and has Marijuana abuse; HTN (hypertension); Supervision of high risk pregnancy, antepartum; Hx of preeclampsia, prior pregnancy, currently pregnant; Antepartum multigravida of advanced maternal age; Lumbar radiculopathy; Grand multiparity with antenatal problem, antepartum; Hypertension affecting pregnancy, antepartum; and History of postpartum depression, currently pregnant on their problem list.  ----------------------------------------------------------------------------------- Patient reports no complaints.  Some increasing pelvic pressure and round ligament pains.  Has had issues with constipation and hemerrhoids.  Also worsening GERD symptoms Contractions: Not present. Vag. Bleeding: None.  Movement: Present. Denies leaking of fluid.  ----------------------------------------------------------------------------------- The following portions of the patient's history were reviewed and updated as appropriate: allergies, current medications, past family history, past medical history, past social history, past surgical history and problem list. Problem list updated.   Objective  Blood pressure 130/80, weight 170 lb (77.1 kg), last menstrual period 09/11/2018, unknown if currently breastfeeding. Pregravid weight 158 lb (71.7 kg) Total Weight Gain 12 lb (5.443 kg) Urinalysis:      Fetal Status: Fetal Heart Rate (bpm): 145   Movement: Present     General:  Alert, oriented and cooperative. Patient is in no acute distress.  Skin: Skin is warm and dry. No rash noted.   Cardiovascular: Normal heart rate noted  Respiratory: Normal respiratory effort, no problems with respiration noted  Abdomen: Soft, gravid, appropriate for gestational age. Pain/Pressure: Present     Pelvic:  Cervical  exam deferred        Extremities: Normal range of motion.     ental Status: Normal mood and affect. Normal behavior. Normal judgment and thought content.     Assessment   43 y.o. [id number] at [redacted]w[redacted]d by  07/12/2019, by Ultrasound presenting for routine prenatal visit  Plan   NINTH Problems (from 11/25/18 to present)    Problem Noted Resolved   [place] multiparity with antenatal problem, antepartum 02/12/2019 by Vena Austria, MD No   Overview Signed 02/12/2019  5:14 PM by Vena Austria, MD    Increased risk of uterine atony and postpartum hemorrhage      Hypertension affecting pregnancy, antepartum 02/12/2019 by Vena Austria, MD No   Overview Signed 02/12/2019  5:17 PM by Vena Austria, MD    [X]  Aspirin 81 mg daily after 12 weeks; discontinue after 36 weeks [X]  baseline labs with CBC, CMP, urine protein/creatinine ratio [X]  no BP meds unless BPs become elevated - on labetalol 100mg  bid [ ]  ultrasound for growth at 28, 32, 36 weeks  Current antihypertensives:  Labetalol   Baseline and surveillance labs (pulled in from [facility], refresh links as needed)  Lab Results  Component Value Date   PLT 389 11/25/2018   CREATININE 0.88 12/20/2018   AST 54 (H) 12/17/2017   ALT 53 (H) 12/17/2017   PROTCRRATIO 0.29 (H) 02/28/2016   LABPROT 11.8 12/28/2016   PROTEIN24HR 110 12/20/2018    Antenatal Testing CHTN - O10.919  Group I  BP < 140/90, no preeclampsia, AGA,  nml AFV, +/- meds    Group II BP > 140/90, on meds, no preeclampsia, AGA, nml AFV  20-28-34-38  20-24-28-32-35-38  32//2 x wk  28//BPP wkly then 32//2 x wk  40 no meds; 39 meds  PRN or 37  Pre-eclampsia  GHTN - O13.9/Preeclampsia without severe features  - O14.00   Preeclampsia with severe features -  O14.10  Q 3-4wks  Q 2 wks  28//BPP wkly then 32//2 x wk  Inpatient  37  PRN or 34         History of postpartum depression, currently pregnant 02/12/2019 by Malachy Mood, MD No   Supervision of  high risk pregnancy, antepartum 11/25/2018 by Rexene Agent, CNM No   Overview Addendum 02/12/2019  5:13 PM by Malachy Mood, MD    [facility]inic [facility] Prenatal Labs  Dating 8wk [place] Blood type: O/Positive/-- (07/08 1051)   Genetic Screen AFP:     NIPS: diploid XY Antibody:Negative (07/08 1051)  Anatomic [place] Normal Rubella: 2.53 (07/08 1051) Varicella:  Immune  GTT Third trimester:  RPR: Non Reactive (07/08 1051)   Rhogam Not needed HBsAg: Negative (07/08 1051)   TDaP vaccine                       Flu Shot: HIV: Non Reactive (07/08 1051)   Baby Food                                GBS:   Contraception  Pap: NIL 2020  CBB   Pelvis tested to 7lbs 2oz  CS/VBAC N/A   Support Person Ahmad             Hx of preeclampsia, prior pregnancy, currently pregnant 11/25/2018 by Rexene Agent, CNM No       Gestational age appropriate obstetric precautions including but not limited to vaginal bleeding, contractions, leaking of fluid and fetal movement were reviewed in detail with the patient.    Constipation - discussed supportive and preventive measures  GERD - omeprazole Rx  Vaginal Candida - diflucan Rx  Hemerrhoids - Rx Anusol rectal cream   CHTN - BP remains well controlled, anxious about preeclampsia risk.  Is doing home BP monitoring.  Will keep follow ups 2 weeks for patient reassurance  Return in about 2 weeks (around 03/16/2019) for ROB.  Malachy Mood, MD, [facility], [facility] 03/02/2019, 4:46 PM

## 2019-03-17 ENCOUNTER — Ambulatory Visit (INDEPENDENT_AMBULATORY_CARE_PROVIDER_SITE_OTHER): Payer: Medicaid Other | Admitting: Obstetrics and Gynecology

## 2019-03-17 ENCOUNTER — Other Ambulatory Visit: Payer: Self-pay

## 2019-03-17 VITALS — BP 130/72 | Wt 174.0 lb

## 2019-03-17 DIAGNOSIS — O09522 Supervision of elderly multigravida, second trimester: Secondary | ICD-10-CM

## 2019-03-17 DIAGNOSIS — O0992 Supervision of high risk pregnancy, unspecified, second trimester: Secondary | ICD-10-CM

## 2019-03-17 DIAGNOSIS — O0940 Supervision of pregnancy with grand multiparity, unspecified trimester: Secondary | ICD-10-CM

## 2019-03-17 DIAGNOSIS — Z3A23 23 weeks gestation of pregnancy: Secondary | ICD-10-CM

## 2019-03-17 DIAGNOSIS — O09292 Supervision of pregnancy with other poor reproductive or obstetric history, second trimester: Secondary | ICD-10-CM

## 2019-03-17 DIAGNOSIS — O09299 Supervision of pregnancy with other poor reproductive or obstetric history, unspecified trimester: Secondary | ICD-10-CM

## 2019-03-17 DIAGNOSIS — O99892 Other specified diseases and conditions complicating childbirth: Secondary | ICD-10-CM

## 2019-03-17 DIAGNOSIS — O09529 Supervision of elderly multigravida, unspecified trimester: Secondary | ICD-10-CM

## 2019-03-17 DIAGNOSIS — O162 Unspecified maternal hypertension, second trimester: Secondary | ICD-10-CM

## 2019-03-17 DIAGNOSIS — O169 Unspecified maternal hypertension, unspecified trimester: Secondary | ICD-10-CM

## 2019-03-17 DIAGNOSIS — O0990 Supervision of high risk pregnancy, unspecified, unspecified trimester: Secondary | ICD-10-CM

## 2019-03-17 DIAGNOSIS — O0942 Supervision of pregnancy with grand multiparity, second trimester: Secondary | ICD-10-CM

## 2019-03-17 DIAGNOSIS — Z8659 Personal history of other mental and behavioral disorders: Secondary | ICD-10-CM

## 2019-03-17 NOTE — Progress Notes (Signed)
Routine Prenatal Care Visit  Subjective  Kelly Huber is a 43 y.o. [id number] at [redacted]w[redacted]d being seen today for ongoing prenatal care.  She is currently monitored for the following issues for this high-risk pregnancy and has Marijuana abuse; HTN (hypertension); Supervision of high risk pregnancy, antepartum; Hx of preeclampsia, prior pregnancy, currently pregnant; Antepartum multigravida of advanced maternal age; Lumbar radiculopathy; Grand multiparity with antenatal problem, antepartum; Hypertension affecting pregnancy, antepartum; and History of postpartum depression, currently pregnant on their problem list.  ----------------------------------------------------------------------------------- Patient reports some increased cramping possible contractions.   Contractions: Irregular. Vag. Bleeding: None.  Movement: Present. Denies leaking of fluid.  ----------------------------------------------------------------------------------- The following portions of the patient's history were reviewed and updated as appropriate: allergies, current medications, past family history, past medical history, past social history, past surgical history and problem list. Problem list updated.   Objective  Blood pressure 130/72, weight 174 lb (78.9 kg), last menstrual period 09/11/2018, unknown if currently breastfeeding. Pregravid weight 158 lb (71.7 kg) Total Weight Gain 16 lb (7.258 kg) Urinalysis:      Fetal Status: Fetal Heart Rate (bpm): 145 Fundal Height: 24 cm Movement: Present     General:  Alert, oriented and cooperative. Patient is in no acute distress.  Skin: Skin is warm and dry. No rash noted.   Cardiovascular: Normal heart rate noted  Respiratory: Normal respiratory effort, no problems with respiration noted  Abdomen: Soft, gravid, appropriate for gestational age. Pain/Pressure: Present     Pelvic:  Cervical exam performed Dilation: Closed Effacement (%): 0 Station: -3  Extremities: Normal range  of motion.     ental Status: Normal mood and affect. Normal behavior. Normal judgment and thought content.     Assessment   43 y.o. [id number] at [redacted]w[redacted]d by  07/12/2019, by Ultrasound presenting for routine prenatal visit  Plan   NINTH Problems (from 11/25/18 to present)    Problem Noted Resolved   [place] multiparity with antenatal problem, antepartum 02/12/2019 by Vena Austria, MD No   Overview Signed 02/12/2019  5:14 PM by Vena Austria, MD    Increased risk of uterine atony and postpartum hemorrhage      Hypertension affecting pregnancy, antepartum 02/12/2019 by Vena Austria, MD No   Overview Signed 02/12/2019  5:17 PM by Vena Austria, MD    [X]  Aspirin 81 mg daily after 12 weeks; discontinue after 36 weeks [X]  baseline labs with CBC, CMP, urine protein/creatinine ratio [X]  no BP meds unless BPs become elevated - on labetalol 100mg  bid [ ]  ultrasound for growth at 28, 32, 36 weeks  Current antihypertensives:  Labetalol   Baseline and surveillance labs (pulled in from [facility], refresh links as needed)  Lab Results  Component Value Date   PLT 389 11/25/2018   CREATININE 0.88 12/20/2018   AST 54 (H) 12/17/2017   ALT 53 (H) 12/17/2017   PROTCRRATIO 0.29 (H) 02/28/2016   LABPROT 11.8 12/28/2016   PROTEIN24HR 110 12/20/2018    Antenatal Testing CHTN - O10.919  Group I  BP < 140/90, no preeclampsia, AGA,  nml AFV, +/- meds    Group II BP > 140/90, on meds, no preeclampsia, AGA, nml AFV  20-28-34-38  20-24-28-32-35-38  32//2 x wk  28//BPP wkly then 32//2 x wk  40 no meds; 39 meds  PRN or 37  Pre-eclampsia  GHTN - O13.9/Preeclampsia without severe features  - O14.00   Preeclampsia with severe features - O14.10  Q 3-4wks  Q 2 wks  28//BPP wkly then 32//2 x wk  Inpatient  37  PRN or 34         History of postpartum depression, currently pregnant 02/12/2019 by Malachy Mood, MD No   Supervision of high risk pregnancy, antepartum 11/25/2018 by  Rexene Agent, CNM No   Overview Addendum 02/12/2019  5:13 PM by Malachy Mood, MD    [facility] Prenatal Labs  Dating 8wk [place] Blood type: O/Positive/-- (07/08 1051)   Genetic Screen AFP:     NIPS: diploid XY Antibody:Negative (07/08 1051)  Anatomic [place] Normal Rubella: 2.53 (07/08 1051) Varicella:  Immune  GTT Third trimester:  RPR: Non Reactive (07/08 1051)   Rhogam Not needed HBsAg: Negative (07/08 1051)   TDaP vaccine                       Flu Shot: HIV: Non Reactive (07/08 1051)   Baby Food                                GBS:   Contraception  Pap: NIL 2020  CBB   Pelvis tested to 7lbs 2oz  CS/VBAC N/A   Support Person Ahmad             Hx of preeclampsia, prior pregnancy, currently pregnant 11/25/2018 by Rexene Agent, CNM No       Gestational age appropriate obstetric precautions including but not limited to vaginal bleeding, contractions, leaking of fluid and fetal movement were reviewed in detail with the patient.    Return in about 2 weeks (around 03/31/2019) for ROB.  Malachy Mood, MD, [facility], [facility] 03/17/2019, 4:38 PM

## 2019-03-17 NOTE — Progress Notes (Signed)
ROB C/o some contractions or round ligament pain Cervical check  Denies lof, no vb Good FM

## 2019-03-29 ENCOUNTER — Ambulatory Visit (INDEPENDENT_AMBULATORY_CARE_PROVIDER_SITE_OTHER): Payer: Medicaid Other | Admitting: Obstetrics and Gynecology

## 2019-03-29 ENCOUNTER — Other Ambulatory Visit: Payer: Self-pay

## 2019-03-29 VITALS — BP 130/80 | Wt 178.0 lb

## 2019-03-29 DIAGNOSIS — O163 Unspecified maternal hypertension, third trimester: Secondary | ICD-10-CM

## 2019-03-29 DIAGNOSIS — O0943 Supervision of pregnancy with grand multiparity, third trimester: Secondary | ICD-10-CM

## 2019-03-29 DIAGNOSIS — O09299 Supervision of pregnancy with other poor reproductive or obstetric history, unspecified trimester: Secondary | ICD-10-CM

## 2019-03-29 DIAGNOSIS — O99893 Other specified diseases and conditions complicating puerperium: Secondary | ICD-10-CM

## 2019-03-29 DIAGNOSIS — O0990 Supervision of high risk pregnancy, unspecified, unspecified trimester: Secondary | ICD-10-CM

## 2019-03-29 DIAGNOSIS — O09523 Supervision of elderly multigravida, third trimester: Secondary | ICD-10-CM

## 2019-03-29 DIAGNOSIS — O169 Unspecified maternal hypertension, unspecified trimester: Secondary | ICD-10-CM

## 2019-03-29 DIAGNOSIS — O0993 Supervision of high risk pregnancy, unspecified, third trimester: Secondary | ICD-10-CM

## 2019-03-29 DIAGNOSIS — O0940 Supervision of pregnancy with grand multiparity, unspecified trimester: Secondary | ICD-10-CM

## 2019-03-29 DIAGNOSIS — Z3A25 25 weeks gestation of pregnancy: Secondary | ICD-10-CM

## 2019-03-29 DIAGNOSIS — O09293 Supervision of pregnancy with other poor reproductive or obstetric history, third trimester: Secondary | ICD-10-CM

## 2019-03-29 DIAGNOSIS — O09529 Supervision of elderly multigravida, unspecified trimester: Secondary | ICD-10-CM

## 2019-03-29 DIAGNOSIS — O99891 Other specified diseases and conditions complicating pregnancy: Secondary | ICD-10-CM

## 2019-03-29 DIAGNOSIS — Z8659 Personal history of other mental and behavioral disorders: Secondary | ICD-10-CM

## 2019-03-29 LAB — POCT URINALYSIS DIPSTICK OB
Glucose, UA: NEGATIVE
POC,PROTEIN,UA: NEGATIVE

## 2019-03-29 NOTE — Progress Notes (Signed)
ROB

## 2019-03-29 NOTE — Progress Notes (Signed)
Routine Prenatal Care Visit  Subjective  Kelly Huber is a 43 y.o. [id number] at [redacted]w[redacted]d being seen today for ongoing prenatal care.  She is currently monitored for the following issues for this high-risk pregnancy and has Marijuana abuse; HTN (hypertension); Supervision of high risk pregnancy, antepartum; Hx of preeclampsia, prior pregnancy, currently pregnant; Antepartum multigravida of advanced maternal age; Lumbar radiculopathy; [place] multiparity with antenatal problem, antepartum; Hypertension affecting pregnancy, antepartum; and History of postpartum depression, currently pregnant on their problem list.  ----------------------------------------------------------------------------------- Patient reports no complaints.   Contractions: Not present. Vag. Bleeding: None.  Movement: Present. Denies leaking of fluid.  ----------------------------------------------------------------------------------- The following portions of the patient's history were reviewed and updated as appropriate: allergies, current medications, past family history, past medical history, past social history, past surgical history and problem list. Problem list updated.   Objective  Blood pressure 130/80, weight 178 lb (80.7 kg), last menstrual period 09/11/2018, unknown if currently breastfeeding. Pregravid weight 158 lb (71.7 kg) Total Weight Gain 20 lb (9.072 kg) Urinalysis:      Fetal Status: Fetal Heart Rate (bpm): 150 Fundal Height: 25 cm Movement: Present  Presentation: Vertex  General:  Alert, oriented and cooperative. Patient is in no acute distress.  Skin: Skin is warm and dry. No rash noted.   Cardiovascular: Normal heart rate noted  Respiratory: Normal respiratory effort, no problems with respiration noted  Abdomen: Soft, gravid, appropriate for gestational age. Pain/Pressure: Present     Pelvic:  Cervical exam deferred        Extremities: Normal range of motion.  Edema: None  ental Status: Normal mood  and affect. Normal behavior. Normal judgment and thought content.     Assessment   43 y.o. [id number] at [redacted]w[redacted]d by  07/12/2019, by Ultrasound presenting for routine prenatal visit  Plan   NINTH Problems (from 11/25/18 to present)    Problem Noted Resolved   [place] multiparity with antenatal problem, antepartum 02/12/2019 by Malachy Mood, MD No   Overview Signed 02/12/2019  5:14 PM by Malachy Mood, MD    Increased risk of uterine atony and postpartum hemorrhage      Hypertension affecting pregnancy, antepartum 02/12/2019 by Malachy Mood, MD No   Overview Signed 02/12/2019  5:17 PM by Malachy Mood, MD    [X]  Aspirin 81 mg daily after 12 weeks; discontinue after 36 weeks [X]  baseline labs with CBC, CMP, urine protein/creatinine ratio [X]  no BP meds unless BPs become elevated - on labetalol 100mg  bid [ ]  ultrasound for growth at 28, 32, 36 weeks  Current antihypertensives:  Labetalol   Baseline and surveillance labs (pulled in from [facility], refresh links as needed)  Lab Results  Component Value Date   PLT 389 11/25/2018   CREATININE 0.88 12/20/2018   AST 54 (H) 12/17/2017   ALT 53 (H) 12/17/2017   PROTCRRATIO 0.29 (H) 02/28/2016   LABPROT 11.8 12/28/2016   PROTEIN24HR 110 12/20/2018    Antenatal Testing CHTN - O10.919  Group I  BP < 140/90, no preeclampsia, AGA,  nml AFV, +/- meds    Group II BP > 140/90, on meds, no preeclampsia, AGA, nml AFV  20-28-34-38  20-24-28-32-35-38  32//2 x wk  28//BPP wkly then 32//2 x wk  40 no meds; 39 meds  PRN or 37  Pre-eclampsia  GHTN - O13.9/Preeclampsia without severe features  - O14.00   Preeclampsia with severe features - O14.10  Q 3-4wks  Q 2 wks  28//BPP wkly then 32//2 x wk  Inpatient  37  PRN or 34         History of postpartum depression, currently pregnant 02/12/2019 by Vena Austria, MD No   Supervision of high risk pregnancy, antepartum 11/25/2018 by Oswaldo Conroy, CNM No   Overview Addendum  02/12/2019  5:13 PM by Vena Austria, MD    [facility] Prenatal Labs  Dating 8wk [place] Blood type: O/Positive/-- (07/08 1051)   Genetic Screen AFP:     NIPS: diploid XY Antibody:Negative (07/08 1051)  Anatomic [place] Normal Rubella: 2.53 (07/08 1051) Varicella:  Immune  GTT Third trimester:  RPR: Non Reactive (07/08 1051)   Rhogam Not needed HBsAg: Negative (07/08 1051)   TDaP vaccine                       Flu Shot: HIV: Non Reactive (07/08 1051)   Baby Food                                GBS:   Contraception  Pap: NIL 2020  CBB   Pelvis tested to 7lbs 2oz  CS/VBAC N/A   Support Person Ahmad             Hx of preeclampsia, prior pregnancy, currently pregnant 11/25/2018 by Oswaldo Conroy, CNM No       Gestational age appropriate obstetric precautions including but not limited to vaginal bleeding, contractions, leaking of fluid and fetal movement were reviewed in detail with the patient.    Return in about 2 weeks (around 04/12/2019) for ROB, 28 week labs, and growth scan.  Vena Austria, MD, [facility], [facility] 03/29/2019, 3:59 PM

## 2019-04-12 ENCOUNTER — Encounter: Payer: Self-pay | Admitting: Obstetrics and Gynecology

## 2019-04-12 ENCOUNTER — Other Ambulatory Visit: Payer: Self-pay

## 2019-04-12 ENCOUNTER — Ambulatory Visit (INDEPENDENT_AMBULATORY_CARE_PROVIDER_SITE_OTHER): Payer: Medicaid Other

## 2019-04-12 ENCOUNTER — Other Ambulatory Visit: Payer: Medicaid Other

## 2019-04-12 ENCOUNTER — Ambulatory Visit (INDEPENDENT_AMBULATORY_CARE_PROVIDER_SITE_OTHER): Payer: Medicaid Other | Admitting: Obstetrics and Gynecology

## 2019-04-12 VITALS — BP 120/80 | Wt 181.0 lb

## 2019-04-12 DIAGNOSIS — O09299 Supervision of pregnancy with other poor reproductive or obstetric history, unspecified trimester: Secondary | ICD-10-CM

## 2019-04-12 DIAGNOSIS — O09292 Supervision of pregnancy with other poor reproductive or obstetric history, second trimester: Secondary | ICD-10-CM

## 2019-04-12 DIAGNOSIS — O0990 Supervision of high risk pregnancy, unspecified, unspecified trimester: Secondary | ICD-10-CM

## 2019-04-12 DIAGNOSIS — Z362 Encounter for other antenatal screening follow-up: Secondary | ICD-10-CM | POA: Diagnosis not present

## 2019-04-12 DIAGNOSIS — O09522 Supervision of elderly multigravida, second trimester: Secondary | ICD-10-CM

## 2019-04-12 DIAGNOSIS — Z3A27 27 weeks gestation of pregnancy: Secondary | ICD-10-CM

## 2019-04-12 DIAGNOSIS — O169 Unspecified maternal hypertension, unspecified trimester: Secondary | ICD-10-CM

## 2019-04-12 DIAGNOSIS — O0940 Supervision of pregnancy with grand multiparity, unspecified trimester: Secondary | ICD-10-CM

## 2019-04-12 DIAGNOSIS — O09529 Supervision of elderly multigravida, unspecified trimester: Secondary | ICD-10-CM

## 2019-04-12 LAB — POCT URINALYSIS DIPSTICK OB: Glucose, UA: NEGATIVE

## 2019-04-12 NOTE — Patient Instructions (Signed)
Third Trimester of Pregnancy The third trimester is from week 28 through week 40 (months 7 through 9). The third trimester is a time when the unborn baby (fetus) is growing rapidly. At the end of the ninth month, the fetus is about 20 inches in length and weighs 6-10 pounds. Body changes during your third trimester Your body will continue to go through many changes during pregnancy. The changes vary from woman to woman. During the third trimester:  Your weight will continue to increase. You can expect to gain 25-35 pounds (11-16 kg) by the end of the pregnancy.  You may begin to get stretch marks on your hips, abdomen, and breasts.  You may urinate more often because the fetus is moving lower into your pelvis and pressing on your bladder.  You may develop or continue to have heartburn. This is caused by increased hormones that slow down muscles in the digestive tract.  You may develop or continue to have constipation because increased hormones slow digestion and cause the muscles that push waste through your intestines to relax.  You may develop hemorrhoids. These are swollen veins (varicose veins) in the rectum that can itch or be painful.  You may develop swollen, bulging veins (varicose veins) in your legs.  You may have increased body aches in the pelvis, back, or thighs. This is due to weight gain and increased hormones that are relaxing your joints.  You may have changes in your hair. These can include thickening of your hair, rapid growth, and changes in texture. Some women also have hair loss during or after pregnancy, or hair that feels dry or thin. Your hair will most likely return to normal after your baby is born.  Your breasts will continue to grow and they will continue to become tender. A yellow fluid (colostrum) may leak from your breasts. This is the first milk you are producing for your baby.  Your belly button may stick out.  You may notice more swelling in your hands,  face, or ankles.  You may have increased tingling or numbness in your hands, arms, and legs. The skin on your belly may also feel numb.  You may feel short of breath because of your expanding uterus.  You may have more problems sleeping. This can be caused by the size of your belly, increased need to urinate, and an increase in your body's metabolism.  You may notice the fetus "dropping," or moving lower in your abdomen (lightening).  You may have increased vaginal discharge.  You may notice your joints feel loose and you may have pain around your pelvic bone. What to expect at prenatal visits You will have prenatal exams every 2 weeks until week 36. Then you will have weekly prenatal exams. During a routine prenatal visit:  You will be weighed to make sure you and the baby are growing normally.  Your blood pressure will be taken.  Your abdomen will be measured to track your baby's growth.  The fetal heartbeat will be listened to.  Any test results from the previous visit will be discussed.  You may have a cervical check near your due date to see if your cervix has softened or thinned (effaced).  You will be tested for Group B streptococcus. This happens between 35 and 37 weeks. Your health care provider may ask you:  What your birth plan is.  How you are feeling.  If you are feeling the baby move.  If you have had any abnormal   symptoms, such as leaking fluid, bleeding, severe headaches, or abdominal cramping.  If you are using any tobacco products, including cigarettes, chewing tobacco, and electronic cigarettes.  If you have any questions. Other tests or screenings that may be performed during your third trimester include:  Blood tests that check for low iron levels (anemia).  Fetal testing to check the health, activity level, and growth of the fetus. Testing is done if you have certain medical conditions or if there are problems during the pregnancy.  Nonstress test  (NST). This test checks the health of your baby to make sure there are no signs of problems, such as the baby not getting enough oxygen. During this test, a belt is placed around your belly. The baby is made to move, and its heart rate is monitored during movement. What is false labor? False labor is a condition in which you feel small, irregular tightenings of the muscles in the womb (contractions) that usually go away with rest, changing position, or drinking water. These are called Braxton Hicks contractions. Contractions may last for hours, days, or even weeks before true labor sets in. If contractions come at regular intervals, become more frequent, increase in intensity, or become painful, you should see your health care provider. What are the signs of labor?  Abdominal cramps.  Regular contractions that start at 10 minutes apart and become stronger and more frequent with time.  Contractions that start on the top of the uterus and spread down to the lower abdomen and back.  Increased pelvic pressure and dull back pain.  A watery or bloody mucus discharge that comes from the vagina.  Leaking of amniotic fluid. This is also known as your "water breaking." It could be a slow trickle or a gush. Let your health care provider know if it has a color or strange odor. If you have any of these signs, call your health care provider right away, even if it is before your due date. Follow these instructions at home: Medicines  Follow your health care provider's instructions regarding medicine use. Specific medicines may be either safe or unsafe to take during pregnancy.  Take a prenatal vitamin that contains at least 600 micrograms (mcg) of folic acid.  If you develop constipation, try taking a stool softener if your health care provider approves. Eating and drinking   Eat a balanced diet that includes fresh fruits and vegetables, whole grains, good sources of protein such as meat, eggs, or tofu,  and low-fat dairy. Your health care provider will help you determine the amount of weight gain that is right for you.  Avoid raw meat and uncooked cheese. These carry germs that can cause birth defects in the baby.  If you have low calcium intake from food, talk to your health care provider about whether you should take a daily calcium supplement.  Eat four or five small meals rather than three large meals a day.  Limit foods that are high in fat and processed sugars, such as fried and sweet foods.  To prevent constipation: ? Drink enough fluid to keep your urine clear or pale yellow. ? Eat foods that are high in fiber, such as fresh fruits and vegetables, whole grains, and beans. Activity  Exercise only as directed by your health care provider. Most women can continue their usual exercise routine during pregnancy. Try to exercise for 30 minutes at least 5 days a week. Stop exercising if you experience uterine contractions.  Avoid heavy lifting.  Do   not exercise in extreme heat or humidity, or at high altitudes.  Wear low-heel, comfortable shoes.  Practice good posture.  You may continue to have sex unless your health care provider tells you otherwise. Relieving pain and discomfort  Take frequent breaks and rest with your legs elevated if you have leg cramps or low back pain.  Take warm sitz baths to soothe any pain or discomfort caused by hemorrhoids. Use hemorrhoid cream if your health care provider approves.  Wear a good support bra to prevent discomfort from breast tenderness.  If you develop varicose veins: ? Wear support pantyhose or compression stockings as told by your healthcare provider. ? Elevate your feet for 15 minutes, 3-4 times a day. Prenatal care  Write down your questions. Take them to your prenatal visits.  Keep all your prenatal visits as told by your health care provider. This is important. Safety  Wear your seat belt at all times when driving.  Make  a list of emergency phone numbers, including numbers for family, friends, the hospital, and police and fire departments. General instructions  Avoid cat litter boxes and soil used by cats. These carry germs that can cause birth defects in the baby. If you have a cat, ask someone to clean the litter box for you.  Do not travel far distances unless it is absolutely necessary and only with the approval of your health care provider.  Do not use hot tubs, steam rooms, or saunas.  Do not drink alcohol.  Do not use any products that contain nicotine or tobacco, such as cigarettes and e-cigarettes. If you need help quitting, ask your health care provider.  Do not use any medicinal herbs or unprescribed drugs. These chemicals affect the formation and growth of the baby.  Do not douche or use tampons or scented sanitary pads.  Do not cross your legs for long periods of time.  To prepare for the arrival of your baby: ? Take prenatal classes to understand, practice, and ask questions about labor and delivery. ? Make a trial run to the hospital. ? Visit the hospital and tour the maternity area. ? Arrange for maternity or paternity leave through employers. ? Arrange for family and friends to take care of pets while you are in the hospital. ? Purchase a rear-facing car seat and make sure you know how to install it in your car. ? Pack your hospital bag. ? Prepare the baby's nursery. Make sure to remove all pillows and stuffed animals from the baby's crib to prevent suffocation.  Visit your dentist if you have not gone during your pregnancy. Use a soft toothbrush to brush your teeth and be gentle when you floss. Contact a health care provider if:  You are unsure if you are in labor or if your water has broken.  You become dizzy.  You have mild pelvic cramps, pelvic pressure, or nagging pain in your abdominal area.  You have lower back pain.  You have persistent nausea, vomiting, or diarrhea.   You have an unusual or bad smelling vaginal discharge.  You have pain when you urinate. Get help right away if:  Your water breaks before 37 weeks.  You have regular contractions less than 5 minutes apart before 37 weeks.  You have a fever.  You are leaking fluid from your vagina.  You have spotting or bleeding from your vagina.  You have severe abdominal pain or cramping.  You have rapid weight loss or weight gain.  You have   shortness of breath with chest pain.  You notice sudden or extreme swelling of your face, hands, ankles, feet, or legs.  Your baby makes fewer than 10 movements in 2 hours.  You have severe headaches that do not go away when you take medicine.  You have vision changes. Summary  The third trimester is from week 28 through week 40, months 7 through 9. The third trimester is a time when the unborn baby (fetus) is growing rapidly.  During the third trimester, your discomfort may increase as you and your baby continue to gain weight. You may have abdominal, leg, and back pain, sleeping problems, and an increased need to urinate.  During the third trimester your breasts will keep growing and they will continue to become tender. A yellow fluid (colostrum) may leak from your breasts. This is the first milk you are producing for your baby.  False labor is a condition in which you feel small, irregular tightenings of the muscles in the womb (contractions) that eventually go away. These are called Braxton Hicks contractions. Contractions may last for hours, days, or even weeks before true labor sets in.  Signs of labor can include: abdominal cramps; regular contractions that start at 10 minutes apart and become stronger and more frequent with time; watery or bloody mucus discharge that comes from the vagina; increased pelvic pressure and dull back pain; and leaking of amniotic fluid. This information is not intended to replace advice given to you by your health  care provider. Make sure you discuss any questions you have with your health care provider. Document Released: 04/30/2001 Document Revised: 08/27/2018 Document Reviewed: 06/11/2016 Elsevier Patient Education  2020 [employer]  

## 2019-04-12 NOTE — Progress Notes (Signed)
ROB/ 28 labs/ [place] C/o swelling in legs/feet, pressure in pelvic area  Denies lof, no vb, Good FM

## 2019-04-12 NOTE — Progress Notes (Signed)
Routine Prenatal Care Visit  Subjective  Kelly Huber is a 43 y.o. G3T5176 at [redacted]w[redacted]d being seen today for ongoing prenatal care.  She is currently monitored for the following issues for this high-risk pregnancy and has Marijuana abuse; HTN (hypertension); Supervision of high risk pregnancy, antepartum; Hx of preeclampsia, prior pregnancy, currently pregnant; Antepartum multigravida of advanced maternal age; Lumbar radiculopathy; [place] multiparity with antenatal problem, antepartum; Hypertension affecting pregnancy, antepartum; and History of postpartum depression, currently pregnant on their problem list.  ----------------------------------------------------------------------------------- Patient reports swelling in her feet, sometimes she is unable to wear crocs. This subsides with elevation of her feet. She has had some pelvic pressure and sharp rectal pains. Sometimes she feels the baby "ball up." This has not happened frequently or for prolonged periods of time.    Contractions: Irregular. Vag. Bleeding: None.  Movement: Present. Denies leaking of fluid.  ----------------------------------------------------------------------------------- The following portions of the patient's history were reviewed and updated as appropriate: allergies, current medications, past family history, past medical history, past social history, past surgical history and problem list. Problem list updated.   Objective  Blood pressure 120/80, weight 181 lb (82.1 kg), last menstrual period 09/11/2018, unknown if currently breastfeeding. Pregravid weight 158 lb (71.7 kg) Total Weight Gain 23 lb (10.4 kg) Urinalysis:      Fetal Status: Fetal Heart Rate (bpm): 150 Fundal Height: 27 cm Movement: Present     General:  Alert, oriented and cooperative. Patient is in no acute distress.  Skin: Skin is warm and dry. No rash noted.   Cardiovascular: Normal heart rate noted  Respiratory: Normal respiratory effort, no  problems with respiration noted  Abdomen: Soft, gravid, appropriate for gestational age. Pain/Pressure: Present     Pelvic:  Cervical exam deferred        Extremities: Normal range of motion.  Edema: Trace  Mental Status: Normal mood and affect. Normal behavior. Normal judgment and thought content.     Assessment   43 y.o. H6W7371 at [redacted]w[redacted]d by  07/12/2019, by Ultrasound presenting for routine prenatal visit  Plan   NINTH Problems (from 11/25/18 to present)    Problem Noted Resolved   [place] multiparity with antenatal problem, antepartum 02/12/2019 by Malachy Mood, MD No   Overview Signed 02/12/2019  5:14 PM by Malachy Mood, MD    Increased risk of uterine atony and postpartum hemorrhage      Hypertension affecting pregnancy, antepartum 02/12/2019 by Malachy Mood, MD No   Overview Signed 02/12/2019  5:17 PM by Malachy Mood, MD    [X]  Aspirin 81 mg daily after 12 weeks; discontinue after 36 weeks [X]  baseline labs with CBC, CMP, urine protein/creatinine ratio [X]  no BP meds unless BPs become elevated - on labetalol 100mg  bid [ ]  ultrasound for growth at 28, 32, 36 weeks  Current antihypertensives:  Labetalol   Baseline and surveillance labs (pulled in from [facility], refresh links as needed)  Lab Results  Component Value Date   PLT 389 11/25/2018   CREATININE 0.88 12/20/2018   AST 54 (H) 12/17/2017   ALT 53 (H) 12/17/2017   PROTCRRATIO 0.29 (H) 02/28/2016   LABPROT 11.8 12/28/2016   PROTEIN24HR 110 12/20/2018    Antenatal Testing CHTN - O10.919  Group I  BP < 140/90, no preeclampsia, AGA,  nml AFV, +/- meds    Group II BP > 140/90, on meds, no preeclampsia, AGA, nml AFV  20-28-34-38  20-24-28-32-35-38  32//2 x wk  28//BPP wkly then 32//2 x wk  40 no  meds; 39 meds  PRN or 37  Pre-eclampsia  GHTN - O13.9/Preeclampsia without severe features  - O14.00   Preeclampsia with severe features - O14.10  Q 3-4wks  Q 2 wks  28//BPP wkly then 32//2 x wk   Inpatient  37  PRN or 34         History of postpartum depression, currently pregnant 02/12/2019 by Vena Austria, MD No   Supervision of high risk pregnancy, antepartum 11/25/2018 by Oswaldo Conroy, CNM No   Overview Addendum 02/12/2019  5:13 PM by Vena Austria, MD    Clinic [facility] Prenatal Labs  Dating 8wk [place] Blood type: O/Positive/-- (07/08 1051)   Genetic Screen AFP:     NIPS: diploid XY Antibody:Negative (07/08 1051)  Anatomic [place] Normal Rubella: 2.53 (07/08 1051) Varicella:  Immune  GTT Third trimester:  RPR: Non Reactive (07/08 1051)   Rhogam Not needed HBsAg: Negative (07/08 1051)   TDaP vaccine                       Flu Shot: HIV: Non Reactive (07/08 1051)   Baby Food                                GBS:   Contraception  Pap: NIL 2020  CBB   Pelvis tested to 7lbs 2oz  CS/VBAC N/A   Support Person Ahmad             Hx of preeclampsia, prior pregnancy, currently pregnant 11/25/2018 by Oswaldo Conroy, CNM No       Gestational age appropriate obstetric precautions including but not limited to vaginal bleeding, contractions, leaking of fluid and fetal movement were reviewed in detail with the patient.    Reviewed preterm labor warning signs. Declines pelvic exam today.  Growth [place] today- normal 28 week labs today.   Return in about 2 weeks (around 04/26/2019) for ROB with MD.  Natale Milch MD [facility], [facility] 04/12/2019, 3:31 PM

## 2019-04-13 LAB — COMPREHENSIVE METABOLIC PANEL
ALT: 9 IU/L (ref 0–32)
AST: 18 IU/L (ref 0–40)
Albumin/Globulin Ratio: 1.3 (ref 1.2–2.2)
Albumin: 3.4 g/dL — ABNORMAL LOW (ref 3.8–4.8)
Alkaline Phosphatase: 97 IU/L (ref 39–117)
BUN/Creatinine Ratio: 13 (ref 9–23)
BUN: 9 mg/dL (ref 6–24)
Bilirubin Total: <0.2 mg/dL (ref 0.0–1.2)
CO2: 17 mmol/L — ABNORMAL LOW (ref 20–29)
Calcium: 9.1 mg/dL (ref 8.7–10.2)
Chloride: 104 mmol/L (ref 96–106)
Creatinine, Ser: 0.71 mg/dL (ref 0.57–1.00)
GFR calc Af Amer: 122 mL/min/{1.73_m2} (ref >59)
GFR calc non Af Amer: 106 mL/min/{1.73_m2} (ref >59)
Globulin, Total: 2.7 g/dL (ref 1.5–4.5)
Glucose: 120 mg/dL — ABNORMAL HIGH (ref 65–99)
Potassium: 3.4 mmol/L — ABNORMAL LOW (ref 3.5–5.2)
Sodium: 136 mmol/L (ref 134–144)
Total Protein: 6.1 g/dL (ref 6.0–8.5)

## 2019-04-13 LAB — 28 WEEK RH+PANEL
Basophils Absolute: 0 10*3/uL (ref 0.0–0.2)
Basos: 0 %
EOS (ABSOLUTE): 0.4 10*3/uL (ref 0.0–0.4)
Eos: 3 %
Gestational Diabetes Screen: 121 mg/dL (ref 65–139)
HIV Screen 4th Generation wRfx: NONREACTIVE
Hematocrit: 28.1 % — ABNORMAL LOW (ref 34.0–46.6)
Hemoglobin: 9.5 g/dL — ABNORMAL LOW (ref 11.1–15.9)
Immature Grans (Abs): 0.2 10*3/uL — ABNORMAL HIGH (ref 0.0–0.1)
Immature Granulocytes: 1 %
Lymphocytes Absolute: 3.1 10*3/uL (ref 0.7–3.1)
Lymphs: 21 %
MCH: 29.5 pg (ref 26.6–33.0)
MCHC: 33.8 g/dL (ref 31.5–35.7)
MCV: 87 fL (ref 79–97)
Monocytes Absolute: 1 10*3/uL — ABNORMAL HIGH (ref 0.1–0.9)
Monocytes: 7 %
Neutrophils Absolute: 10.4 10*3/uL — ABNORMAL HIGH (ref 1.4–7.0)
Neutrophils: 68 %
Platelets: 384 10*3/uL (ref 150–450)
RBC: 3.22 x10E6/uL — ABNORMAL LOW (ref 3.77–5.28)
RDW: 13.6 % (ref 11.7–15.4)
RPR Ser Ql: NONREACTIVE
WBC: 15.1 10*3/uL — ABNORMAL HIGH (ref 3.4–10.8)

## 2019-04-20 ENCOUNTER — Telehealth: Payer: Self-pay | Admitting: Obstetrics and Gynecology

## 2019-04-20 ENCOUNTER — Other Ambulatory Visit: Payer: Self-pay | Admitting: Obstetrics and Gynecology

## 2019-04-20 DIAGNOSIS — O99013 Anemia complicating pregnancy, third trimester: Secondary | ICD-10-CM

## 2019-04-20 DIAGNOSIS — O0990 Supervision of high risk pregnancy, unspecified, unspecified trimester: Secondary | ICD-10-CM

## 2019-04-20 MED ORDER — FUSION 65-65-25-30 MG PO CAPS: 1.0000 | ORAL_CAPSULE | Freq: Every day | ORAL | 11 refills | Status: DC

## 2019-04-20 NOTE — Telephone Encounter (Signed)
Patient is calling with a problem with her Iron Pills. Patient states her insurance doesn't cover Iron pills and is wanting know what is the next step to replace those. Please advise

## 2019-04-21 ENCOUNTER — Other Ambulatory Visit: Payer: Self-pay | Admitting: Obstetrics and Gynecology

## 2019-04-21 MED ORDER — FERROUS SULFATE 325 (65 FE) MG PO TABS: 325.0000 mg | ORAL_TABLET | Freq: Every day | ORAL | 1 refills | Status: DC

## 2019-04-21 NOTE — Telephone Encounter (Signed)
Sent Rx for ferrous sulfate which is generic

## 2019-04-21 NOTE — Telephone Encounter (Signed)
Voicemail is full unable to leave voicemail

## 2019-04-25 NOTE — Progress Notes (Deleted)
[facility]  [phone] [phone]  ID: Kelly Huber OB: Dec 18, 1975  MR#: [id number]  HKV#:[id number]  Patient Care Team: Sandrea Hughs, NP as PCP - General (Nurse Practitioner)  CHIEF COMPLAINT: Anemia during third trimester pregnancy.  INTERVAL HISTORY: ***  REVIEW OF SYSTEMS:   ROS  As per HPI. Otherwise, a complete review of systems is negative.  PAST MEDICAL HISTORY: Past Medical History:  Diagnosis Date  . Advanced maternal age in multigravida   . Alcohol intoxication with delirium (HCC) 12/28/2016  . Chronic hypertension   . Gestational diabetes   . Gestational hypertension 02/29/2016  . History of postpartum depression   . Marijuana use     PAST SURGICAL HISTORY: Past Surgical History:  Procedure Laterality Date  . cataract surgery    . dilation and currette      FAMILY HISTORY: Family History  Problem Relation Age of Onset  . Hypertension Mother   . Bipolar disorder Father   . Schizophrenia Father   . Bipolar disorder Brother        bothers x2    ADVANCED DIRECTIVES (Y/N):  N  HEALTH MAINTENANCE: Social History   Tobacco Use  . Smoking status: Former Smoker    Packs/day: 0.25    Years: 20.00    Pack years: 5.00    Types: Cigarettes    Quit date: 12/19/2015    Years since quitting: 3.3  . Smokeless tobacco: Never Used  Substance Use Topics  . Alcohol use: No  . Drug use: Yes    Types: Marijuana    Comment: none in last month     Colonoscopy:  PAP:  Bone density:  Lipid panel:  Allergies  Allergen Reactions  . Codeine Hives and Shortness Of Breath  . Penicillins Hives and Shortness Of Breath    Current Outpatient Medications  Medication Sig Dispense Refill  . cetirizine (ZYRTEC) 10 MG tablet Take 10 mg by mouth daily.    Marland Kitchen doxylamine, Sleep, (UNISOM) 25 MG tablet Take 25 mg by mouth at bedtime as needed.    . ferrous sulfate (FERROUSUL) 325 (65 FE) MG tablet Take 1 tablet (325 mg total)  by mouth daily with breakfast. 60 tablet 1  . fluticasone (FLONASE) 50 MCG/ACT nasal spray Place into both nostrils daily.    . hydrocortisone (ANUSOL-HC) 2.5 % rectal cream Place 1 application rectally 2 (two) times daily. 30 g 0  . hydrocortisone cream 1 % Apply 1 application topically 2 (two) times daily.    Marland Kitchen LABETALOL HCL PO Take 100 mg by mouth daily.     . nicotine (NICODERM CQ - DOSED IN MG/24 HOURS) 14 mg/24hr patch Place 14 mg onto the skin daily.    Marland Kitchen omeprazole (PRILOSEC) 20 MG capsule Take 1 capsule (20 mg total) by mouth daily. 30 capsule 11  . Prenat-Fe Poly-Methfol-FA-DHA (VITAFOL FE+) 90-0.6-0.4-200 MG CAPS Take 1 tablet by mouth daily. 30 capsule 11  . prenatal vitamin w/FE, FA (PRENATAL 1 + 1) 27-1 MG TABS tablet Prenatal Plus (calcium carbonate) 27 mg iron-1 mg tablet  TAKE 1 TABLET BY MOUTH DAILY WHILE TRYING TO CONCEIVE, PREGNANT & BREASFEEDING    . triamcinolone cream (KENALOG) 0.5 % Apply 1 application topically 3 (three) times daily. 30 g 0   No current facility-administered medications for this visit.     OBJECTIVE: There were no vitals filed for this visit.   There is no height or weight on file to calculate BMI.    ECOG FS:{CHL  ONC EX:[id number]}  General: Well-developed, well-nourished, no acute distress. Eyes: Pink conjunctiva, anicteric sclera. HEENT: Normocephalic, moist mucous membranes, clear oropharnyx. Lungs: Clear to auscultation bilaterally. Heart: Regular rate and rhythm. No rubs, murmurs, or gallops. Abdomen: Soft, nontender, nondistended. No organomegaly noted, normoactive bowel sounds. Musculoskeletal: No edema, cyanosis, or clubbing. Neuro: Alert, answering all questions appropriately. Cranial nerves grossly intact. Skin: No rashes or petechiae noted. Psych: Normal affect. Lymphatics: No cervical, calvicular, axillary or inguinal LAD.   LAB RESULTS:  Lab Results  Component Value Date   NA 136 04/12/2019   K 3.4 (L) 04/12/2019   CL 104  04/12/2019   CO2 17 (L) 04/12/2019   GLUCOSE 120 (H) 04/12/2019   BUN 9 04/12/2019   CREATININE 0.71 04/12/2019   CALCIUM 9.1 04/12/2019   PROT 6.1 04/12/2019   ALBUMIN 3.4 (L) 04/12/2019   AST 18 04/12/2019   ALT 9 04/12/2019   ALKPHOS 97 04/12/2019   BILITOT <0.2 04/12/2019   GFRNONAA 106 04/12/2019   GFRAA 122 04/12/2019    Lab Results  Component Value Date   WBC 15.1 (H) 04/12/2019   NEUTROABS 10.4 (H) 04/12/2019   HGB 9.5 (L) 04/12/2019   HCT 28.1 (L) 04/12/2019   MCV 87 04/12/2019   PLT 384 04/12/2019     STUDIES: US Ob Follow Up  Result Date: 04/12/2019 Patient Name: Kelly Huber DOB: July 28, 1975 MRN: [id number] ULTRASOUND REPORT Location: [facility] Date of Service: 04/12/2019 Indications:growth/afi Findings: [place] intrauterine pregnancy is visualized with FHR at 141 BPM. Biometrics give an (U/S) Gestational age of [redacted]w[redacted]d and an (U/S) EDD of 07/13/2019; this correlates with the clinically established Estimated Date of Delivery: 07/12/19. Fetal presentation is Cephalic. Placenta: anterior. Grade: 1 AFI: 17.1 cm Growth percentile is 42.9%. EFW: 952 g ( 2 lb 2 oz ) Impression: 1. [redacted]w[redacted]d Viable [place] Intrauterine pregnancy previously established criteria. 2. Growth is 42.9 %ile.  AFI is 17.1 cm. Recommendations: 1.Clinical correlation with the patient's History and Physical Exam. Gweneth Dimitri, RT I have reviewed this ultrasound and the report. I agree with the above assessment and plan. [facility] 04/12/19 4:16 PM     ASSESSMENT: Anemia during third trimester pregnancy  PLAN:    1. Anemia during third trimester pregnancy:  Patient expressed understanding and was in agreement with this plan. She also understands that She can call clinic at any time with any questions, concerns, or complaints.   Cancer Staging No matching staging information was found for the patient.  Lloyd Huger, MD   04/25/2019  8:25 AM

## 2019-04-27 ENCOUNTER — Ambulatory Visit (INDEPENDENT_AMBULATORY_CARE_PROVIDER_SITE_OTHER): Payer: Medicaid Other | Admitting: Obstetrics and Gynecology

## 2019-04-27 ENCOUNTER — Other Ambulatory Visit: Payer: Self-pay

## 2019-04-27 VITALS — BP 134/88 | Wt 182.0 lb

## 2019-04-27 DIAGNOSIS — O0943 Supervision of pregnancy with grand multiparity, third trimester: Secondary | ICD-10-CM

## 2019-04-27 DIAGNOSIS — O169 Unspecified maternal hypertension, unspecified trimester: Secondary | ICD-10-CM

## 2019-04-27 DIAGNOSIS — O0993 Supervision of high risk pregnancy, unspecified, third trimester: Secondary | ICD-10-CM

## 2019-04-27 DIAGNOSIS — O163 Unspecified maternal hypertension, third trimester: Secondary | ICD-10-CM

## 2019-04-27 DIAGNOSIS — O0990 Supervision of high risk pregnancy, unspecified, unspecified trimester: Secondary | ICD-10-CM

## 2019-04-27 DIAGNOSIS — O09529 Supervision of elderly multigravida, unspecified trimester: Secondary | ICD-10-CM

## 2019-04-27 DIAGNOSIS — O99013 Anemia complicating pregnancy, third trimester: Secondary | ICD-10-CM

## 2019-04-27 DIAGNOSIS — Z3A29 29 weeks gestation of pregnancy: Secondary | ICD-10-CM

## 2019-04-27 DIAGNOSIS — O09523 Supervision of elderly multigravida, third trimester: Secondary | ICD-10-CM

## 2019-04-27 DIAGNOSIS — O09299 Supervision of pregnancy with other poor reproductive or obstetric history, unspecified trimester: Secondary | ICD-10-CM

## 2019-04-27 DIAGNOSIS — O0940 Supervision of pregnancy with grand multiparity, unspecified trimester: Secondary | ICD-10-CM

## 2019-04-27 DIAGNOSIS — O09293 Supervision of pregnancy with other poor reproductive or obstetric history, third trimester: Secondary | ICD-10-CM

## 2019-04-27 DIAGNOSIS — O99891 Other specified diseases and conditions complicating pregnancy: Secondary | ICD-10-CM

## 2019-04-27 DIAGNOSIS — Z8659 Personal history of other mental and behavioral disorders: Secondary | ICD-10-CM

## 2019-04-27 NOTE — Progress Notes (Signed)
Routine Prenatal Care Visit  Subjective  Kelly Huber is a 43 y.o. [id number] at [redacted]w[redacted]d being seen today for ongoing prenatal care.  She is currently monitored for the following issues for this high-risk pregnancy and has Marijuana abuse; HTN (hypertension); Supervision of high risk pregnancy, antepartum; Hx of preeclampsia, prior pregnancy, currently pregnant; Antepartum multigravida of advanced maternal age; Lumbar radiculopathy; Addison multiparity with antenatal problem, antepartum; Hypertension affecting pregnancy, antepartum; History of postpartum depression, currently pregnant; and Anemia during pregnancy in third trimester on their problem list.  ----------------------------------------------------------------------------------- Patient reports no complaints.   Contractions: Irregular. Vag. Bleeding: None.  Movement: Present. Denies leaking of fluid.  ----------------------------------------------------------------------------------- The following portions of the patient's history were reviewed and updated as appropriate: allergies, current medications, past family history, past medical history, past social history, past surgical history and problem list. Problem list updated.   Objective  Blood pressure 134/88, weight 182 lb (82.6 kg), last menstrual period 09/11/2018, unknown if currently breastfeeding. Pregravid weight 158 lb (71.7 kg) Total Weight Gain 24 lb (10.9 kg) Urinalysis:      Fetal Status: Fetal Heart Rate (bpm): 155 Fundal Height: 29 cm Movement: Present  Presentation: Vertex  General:  Alert, oriented and cooperative. Patient is in no acute distress.  Skin: Skin is warm and dry. No rash noted.   Cardiovascular: Normal heart rate noted  Respiratory: Normal respiratory effort, no problems with respiration noted  Abdomen: Soft, gravid, appropriate for gestational age. Pain/Pressure: Present     Pelvic:  Cervical exam deferred        Extremities: Normal range of motion.      ental Status: Normal mood and affect. Normal behavior. Normal judgment and thought content.     Assessment   43 y.o. [id number] at [redacted]w[redacted]d by  07/12/2019, by Ultrasound presenting for routine prenatal visit  Plan   NINTH Problems (from 11/25/18 to present)    Problem Noted Resolved   Poth multiparity with antenatal problem, antepartum 02/12/2019 by Malachy Mood, MD No   Overview Signed 02/12/2019  5:14 PM by Malachy Mood, MD    Increased risk of uterine atony and postpartum hemorrhage      Hypertension affecting pregnancy, antepartum 02/12/2019 by Malachy Mood, MD No   Overview Signed 02/12/2019  5:17 PM by Malachy Mood, MD    [X]  Aspirin 81 mg daily after 12 weeks; discontinue after 36 weeks [X]  baseline labs with CBC, CMP, urine protein/creatinine ratio [X]  no BP meds unless BPs become elevated - on labetalol 100mg  bid [ ]  ultrasound for growth at 28, 32, 36 weeks  Current antihypertensives:  Labetalol   Baseline and surveillance labs (pulled in from [facility], refresh links as needed)  Lab Results  Component Value Date   PLT 389 11/25/2018   CREATININE 0.88 12/20/2018   AST 54 (H) 12/17/2017   ALT 53 (H) 12/17/2017   PROTCRRATIO 0.29 (H) 02/28/2016   LABPROT 11.8 12/28/2016   PROTEIN24HR 110 12/20/2018    Antenatal Testing CHTN - O10.919  Group I  BP < 140/90, no preeclampsia, AGA,  nml AFV, +/- meds    Group II BP > 140/90, on meds, no preeclampsia, AGA, nml AFV  20-28-34-38  20-24-28-32-35-38  32//2 x wk  28//BPP wkly then 32//2 x wk  40 no meds; 39 meds  PRN or 37  Pre-eclampsia  GHTN - O13.9/Preeclampsia without severe features  - O14.00   Preeclampsia with severe features - O14.10  Q 3-4wks  Q 2 wks  28//BPP wkly then 32//2  x wk  Inpatient  37  PRN or 34         History of postpartum depression, currently pregnant 02/12/2019 by Vena Austria, MD No   Supervision of high risk pregnancy, antepartum 11/25/2018 by Oswaldo Conroy,  CNM No   Overview Addendum 02/12/2019  5:13 PM by Vena Austria, MD    [facility] Prenatal Labs  Dating 8wk [place] Blood type: O/Positive/-- (07/08 1051)   Genetic Screen AFP:     NIPS: diploid XY Antibody:Negative (07/08 1051)  Anatomic [place] Normal Rubella: 2.53 (07/08 1051) Varicella:  Immune  GTT Third trimester:  RPR: Non Reactive (07/08 1051)   Rhogam Not needed HBsAg: Negative (07/08 1051)   TDaP vaccine                       Flu Shot: HIV: Non Reactive (07/08 1051)   Baby Food                                GBS:   Contraception  Pap: NIL 2020  CBB   Pelvis tested to 7lbs 2oz  CS/VBAC N/A   Support Person Ahmad             Hx of preeclampsia, prior pregnancy, currently pregnant 11/25/2018 by Oswaldo Conroy, CNM No       Gestational age appropriate obstetric precautions including but not limited to vaginal bleeding, contractions, leaking of fluid and fetal movement were reviewed in detail with the patient.    Return in about 2 weeks (around 05/11/2019) for ROB and growth scan.  Vena Austria, MD, [facility], [facility] 04/27/2019, 4:56 PM

## 2019-04-27 NOTE — Progress Notes (Signed)
ROB Discharge Went to Urgent care for facial abcess

## 2019-04-30 ENCOUNTER — Inpatient Hospital Stay: Payer: Medicaid Other | Admitting: Oncology

## 2019-05-05 ENCOUNTER — Observation Stay
Admission: RE | Admit: 2019-05-05 | Discharge: 2019-05-06 | Disposition: A | Discharge status: Home / Self Care | Payer: Medicaid Other | Source: Intra-hospital | Attending: Certified Nurse Midwife | Admitting: Certified Nurse Midwife

## 2019-05-05 ENCOUNTER — Other Ambulatory Visit: Payer: Self-pay

## 2019-05-05 ENCOUNTER — Emergency Department: Payer: Medicaid Other

## 2019-05-05 ENCOUNTER — Emergency Department
Admission: EM | Admit: 2019-05-05 | Discharge: 2019-05-05 | Disposition: A | Discharge status: Home / Self Care | Payer: Medicaid Other | Source: Home / Self Care | Attending: Emergency Medicine | Admitting: Emergency Medicine

## 2019-05-05 DIAGNOSIS — Z880 Allergy status to penicillin: Secondary | ICD-10-CM | POA: Insufficient documentation

## 2019-05-05 DIAGNOSIS — O133 Gestational [pregnancy-induced] hypertension without significant proteinuria, third trimester: Secondary | ICD-10-CM | POA: Insufficient documentation

## 2019-05-05 DIAGNOSIS — E876 Hypokalemia: Secondary | ICD-10-CM | POA: Diagnosis not present

## 2019-05-05 DIAGNOSIS — Z885 Allergy status to narcotic agent status: Secondary | ICD-10-CM | POA: Diagnosis not present

## 2019-05-05 DIAGNOSIS — Z87891 Personal history of nicotine dependence: Secondary | ICD-10-CM | POA: Diagnosis not present

## 2019-05-05 DIAGNOSIS — O0943 Supervision of pregnancy with grand multiparity, third trimester: Secondary | ICD-10-CM | POA: Insufficient documentation

## 2019-05-05 DIAGNOSIS — Z7982 Long term (current) use of aspirin: Secondary | ICD-10-CM | POA: Diagnosis not present

## 2019-05-05 DIAGNOSIS — M79605 Pain in left leg: Secondary | ICD-10-CM | POA: Diagnosis present

## 2019-05-05 DIAGNOSIS — O24419 Gestational diabetes mellitus in pregnancy, unspecified control: Secondary | ICD-10-CM | POA: Diagnosis not present

## 2019-05-05 DIAGNOSIS — O09529 Supervision of elderly multigravida, unspecified trimester: Secondary | ICD-10-CM

## 2019-05-05 DIAGNOSIS — Z3A30 30 weeks gestation of pregnancy: Secondary | ICD-10-CM | POA: Diagnosis not present

## 2019-05-05 DIAGNOSIS — W108XXA Fall (on) (from) other stairs and steps, initial encounter: Secondary | ICD-10-CM | POA: Diagnosis present

## 2019-05-05 DIAGNOSIS — Z3493 Encounter for supervision of normal pregnancy, unspecified, third trimester: Secondary | ICD-10-CM

## 2019-05-05 DIAGNOSIS — M549 Dorsalgia, unspecified: Secondary | ICD-10-CM | POA: Insufficient documentation

## 2019-05-05 DIAGNOSIS — O9A213 Injury, poisoning and certain other consequences of external causes complicating pregnancy, third trimester: Principal | ICD-10-CM | POA: Insufficient documentation

## 2019-05-05 DIAGNOSIS — W109XXA Fall (on) (from) unspecified stairs and steps, initial encounter: Secondary | ICD-10-CM | POA: Diagnosis not present

## 2019-05-05 DIAGNOSIS — Z79899 Other long term (current) drug therapy: Secondary | ICD-10-CM | POA: Diagnosis not present

## 2019-05-05 DIAGNOSIS — O09523 Supervision of elderly multigravida, third trimester: Secondary | ICD-10-CM | POA: Insufficient documentation

## 2019-05-05 DIAGNOSIS — W19XXXA Unspecified fall, initial encounter: Secondary | ICD-10-CM

## 2019-05-05 LAB — COMPREHENSIVE METABOLIC PANEL
ALT: 12 U/L (ref 0–44)
AST: 17 U/L (ref 15–41)
Albumin: 3.3 g/dL — ABNORMAL LOW (ref 3.5–5.0)
Alkaline Phosphatase: 100 U/L (ref 38–126)
Anion gap: 10 (ref 5–15)
BUN: 9 mg/dL (ref 6–20)
CO2: 17 mmol/L — ABNORMAL LOW (ref 22–32)
Calcium: 9.1 mg/dL (ref 8.9–10.3)
Chloride: 105 mmol/L (ref 98–111)
Creatinine, Ser: 0.64 mg/dL (ref 0.44–1.00)
GFR calc Af Amer: >60 mL/min (ref >60)
GFR calc non Af Amer: >60 mL/min (ref >60)
Glucose, Bld: 110 mg/dL — ABNORMAL HIGH (ref 70–99)
Potassium: 3.3 mmol/L — ABNORMAL LOW (ref 3.5–5.1)
Sodium: 132 mmol/L — ABNORMAL LOW (ref 135–145)
Total Bilirubin: 0.3 mg/dL (ref 0.3–1.2)
Total Protein: 7.4 g/dL (ref 6.5–8.1)

## 2019-05-05 LAB — CBC
HCT: 29.7 % — ABNORMAL LOW (ref 36.0–46.0)
Hemoglobin: 10 g/dL — ABNORMAL LOW (ref 12.0–15.0)
MCH: 29.3 pg (ref 26.0–34.0)
MCHC: 33.7 g/dL (ref 30.0–36.0)
MCV: 87.1 fL (ref 80.0–100.0)
Platelets: 407 10*3/uL — ABNORMAL HIGH (ref 150–400)
RBC: 3.41 MIL/uL — ABNORMAL LOW (ref 3.87–5.11)
RDW: 14.4 % (ref 11.5–15.5)
WBC: 18.9 10*3/uL — ABNORMAL HIGH (ref 4.0–10.5)
nRBC: 0 % (ref 0.0–0.2)

## 2019-05-05 LAB — PROTEIN / CREATININE RATIO, URINE
Creatinine, Urine: 218 mg/dL
Protein Creatinine Ratio: 0.15 mg/mg{Cre} (ref 0.00–0.15)
Total Protein, Urine: 33 mg/dL

## 2019-05-05 LAB — APTT: aPTT: 30 seconds (ref 24–36)

## 2019-05-05 LAB — FIBRINOGEN: Fibrinogen: 664 mg/dL — ABNORMAL HIGH (ref 210–475)

## 2019-05-05 LAB — PROTIME-INR
INR: 0.9 (ref 0.8–1.2)
Prothrombin Time: 12.1 seconds (ref 11.4–15.2)

## 2019-05-05 MED ORDER — LABETALOL HCL 100 MG PO TABS
ORAL_TABLET | ORAL | Status: AC
  Administered 2019-05-05: 100 mg
  Filled 2019-05-05: qty 1

## 2019-05-05 MED ORDER — LABETALOL HCL 100 MG PO TABS: 100.0000 mg | ORAL_TABLET | Freq: Once | ORAL | Status: AC

## 2019-05-05 MED ORDER — HYDROMORPHONE HCL 1 MG/ML IJ SOLN
0.5000 mg | INTRAMUSCULAR | Status: AC
  Administered 2019-05-05: 20:00:00 0.5 mg via INTRAMUSCULAR
  Filled 2019-05-05: qty 1

## 2019-05-05 MED ORDER — ACETAMINOPHEN 500 MG PO TABS
1000.0000 mg | ORAL_TABLET | Freq: Once | ORAL | Status: AC
  Administered 2019-05-05: 1000 mg via ORAL
  Filled 2019-05-05: qty 2

## 2019-05-05 NOTE — ED Notes (Signed)
Patient transported to X-ray 

## 2019-05-05 NOTE — Progress Notes (Signed)
Pt is a G9P6 seen at [facility] w/ c/o of a fall that occurred approximately 1700. Pt rates her pain a 6/10. Pt denies vaginal bleeding, LOF and states positive fetal movement.

## 2019-05-05 NOTE — Discharge Instructions (Addendum)
Your x-rays of the foot and ankle are unremarkable.  Please go straight to labor and delivery for fetal monitoring.

## 2019-05-05 NOTE — ED Triage Notes (Addendum)
Pt is G8 P6. [redacted] weeks pregnant, fell down flight of stairs one hour ago. States she has felt fetal movement since. Pt c/o pain in upper and lower back and left hip and left leg. Fetal heart rate 147 bpm

## 2019-05-05 NOTE — ED Provider Notes (Signed)
[facility] Emergency Department Provider Note  ____________________________________________  Time seen: Approximately 7:23 PM  I have reviewed the triage vital signs and the nursing notes.   HISTORY  Chief Complaint Fall    HPI Kelly Huber is a 43 y.o. female with a history of chronic hypertension, gestational diabetes, currently pregnant at [redacted] weeks 2 days, G9, P6, [facility] who was in her usual state of health until she had a slip and fall down a flight of stairs at approximately 6:15 PM.  No head injury or loss of consciousness.  Complains of pain in the left side of her back, pain in the left hip, pain over the inguinal ligament on the left, pain in the left foot and ankle.  Reports inability to bear weight on the left foot after the fall.  Does not believe that she had any direct abdominal trauma.  Denies any vaginal bleeding fluid leakage or contraction/crampy pain.  Reports feeling fetal movements since the fall.  Pain is constant, severe, 10/10, worse with leg movement.  No alleviating factors.      Past Medical History:  Diagnosis Date  . Advanced maternal age in multigravida   . Alcohol intoxication with delirium (HCC) 12/28/2016  . Chronic hypertension   . Gestational diabetes   . Gestational hypertension 02/29/2016  . History of postpartum depression   . Marijuana use      Patient Active Problem List   Diagnosis Date Noted  . Anemia during pregnancy in third trimester 04/20/2019  . Grand multiparity with antenatal problem, antepartum 02/12/2019  . Hypertension affecting pregnancy, antepartum 02/12/2019  . History of postpartum depression, currently pregnant 02/12/2019  . Supervision of high risk pregnancy, antepartum 11/25/2018  . Hx of preeclampsia, prior pregnancy, currently pregnant 11/25/2018  . Antepartum multigravida of advanced maternal age 12/26/2018  . Marijuana abuse 12/28/2016  . HTN (hypertension) 12/28/2016   . Lumbar radiculopathy 06/12/2016     Past Surgical History:  Procedure Laterality Date  . cataract surgery    . dilation and currette       Prior to Admission medications   Medication Sig Start Date End Date Taking? Authorizing Provider  cetirizine (ZYRTEC) 10 MG tablet Take 10 mg by mouth daily.    [provider]  doxylamine, Sleep, (UNISOM) 25 MG tablet Take 25 mg by mouth at bedtime as needed.    [provider]  ferrous sulfate (FERROUSUL) 325 (65 FE) MG tablet Take 1 tablet (325 mg total) by mouth daily with breakfast. 04/21/19   Vena AustriaStaebler, Andreas, MD  fluticasone [facility](FLONASE) 50 MCG/ACT nasal spray Place into both nostrils daily.    [provider]  hydrocortisone (ANUSOL-HC) 2.5 % rectal cream Place 1 application rectally 2 (two) times daily. 03/02/19   Vena AustriaStaebler, Andreas, MD  hydrocortisone cream 1 % Apply 1 application topically 2 (two) times daily.    [provider]  LABETALOL HCL PO Take 100 mg by mouth daily.     [provider]  nicotine (NICODERM CQ - DOSED IN MG/24 HOURS) 14 mg/24hr patch Place 14 mg onto the skin daily.    [provider]  omeprazole (PRILOSEC) 20 MG capsule Take 1 capsule (20 mg total) by mouth daily. 03/02/19   Vena AustriaStaebler, Andreas, MD  Prenat-Fe Poly-Methfol-FA-DHA (VITAFOL FE+) 90-0.6-0.4-200 MG CAPS Take 1 tablet by mouth daily. 12/17/18   Schuman, Jaquelyn Bitterhristanna R, MD  prenatal vitamin w/FE, FA (PRENATAL 1 + 1) 27-1 MG TABS tablet Prenatal Plus (calcium carbonate) 27  mg iron-1 mg tablet  TAKE 1 TABLET BY MOUTH DAILY WHILE TRYING TO CONCEIVE, PREGNANT & BREASFEEDING    [provider]  triamcinolone cream (KENALOG) 0.5 % Apply 1 application topically 3 (three) times daily. 01/28/19   Rod Can, CNM     Allergies Codeine and Penicillins   Family History  Problem Relation Age of Onset  . Hypertension Mother   . Bipolar disorder Father   . Schizophrenia Father   . Bipolar disorder  Brother        bothers x2    Social History Social History   Tobacco Use  . Smoking status: Former Smoker    Packs/day: 0.25    Years: 20.00    Pack years: 5.00    Types: Cigarettes    Quit date: 12/19/2015    Years since quitting: 3.3  . Smokeless tobacco: Never Used  Substance Use Topics  . Alcohol use: No  . Drug use: Yes    Types: Marijuana    Comment: none in last month    Review of Systems  Constitutional:   No fever or chills.  ENT:   No sore throat. No rhinorrhea. Cardiovascular:   No chest pain or syncope. Respiratory:   No dyspnea or cough. Gastrointestinal:   Negative for abdominal pain, vomiting and diarrhea.  Musculoskeletal:   Left leg pain as above.  Back pain. All other systems reviewed and are negative except as documented above in ROS and HPI.  ____________________________________________   PHYSICAL EXAM:  VITAL SIGNS: ED Triage Vitals  Enc Vitals Group     BP 05/05/19 1904 (!) 162/99     Pulse Rate 05/05/19 1904 90     Resp 05/05/19 1904 17     Temp 05/05/19 1904 98.4 F (36.9 C)     Temp Source 05/05/19 1904 Oral     SpO2 05/05/19 1904 98 %     Weight 05/05/19 1907 184 lb (83.5 kg)     Height 05/05/19 1907 5\' 4"  (1.626 m)     Head Circumference --      Peak Flow --      Pain Score 05/05/19 1906 10     Pain Loc --      Pain Edu? --      Excl. in [place]? --     Vital signs reviewed, nursing assessments reviewed.   Constitutional:   Alert and oriented. Non-toxic appearance. Eyes:   Conjunctivae are normal. EOMI. PERRL. ENT      Head:   Normocephalic and atraumatic.      Nose:   Wearing a mask.      Mouth/Throat:   Wearing a mask.      Neck:   No meningismus. Full ROM.  No midline spinal tenderness Hematological/Lymphatic/Immunilogical:   No cervical lymphadenopathy. Cardiovascular:   RRR. Symmetric bilateral radial and DP pulses.  No murmurs. Cap refill less than 2 seconds. Respiratory:   Normal respiratory effort without  tachypnea/retractions. Breath sounds are clear and equal bilaterally. No wheezes/rales/rhonchi. Gastrointestinal:   Soft and nontender.  Gravid, size consistent with dates.  No rebound, rigidity, or guarding.  Musculoskeletal:   Tenderness over the left pubic rami, left femoral neck area.  Tenderness over the ankle malleoli as well as the fifth metatarsal of the foot.  No deformities or focal swelling or ecchymosis.  No open wounds.  There is pain with range of motion of the ankle and the hip. Neurologic:   Normal speech and language.  Motor grossly intact.  No acute focal neurologic deficits are appreciated.  Skin:    Skin is warm, dry and intact. No rash noted.  No petechiae, purpura, or bullae.  ____________________________________________    LABS (pertinent positives/negatives) (all labs ordered are listed, but only abnormal results are displayed) Labs Reviewed - No data to display ____________________________________________   EKG    ____________________________________________    RADIOLOGY  DG Ankle Complete Left  Result Date: 05/05/2019 CLINICAL DATA:  Fall.  Left foot and ankle pain EXAM: LEFT ANKLE COMPLETE - 3+ VIEW COMPARISON:  None. FINDINGS: There is no evidence of fracture, dislocation, or joint effusion. There is no evidence of arthropathy or other focal bone abnormality. Soft tissues are unremarkable. IMPRESSION: Negative. Electronically Signed   By: Charlett Nose M.D.   On: 05/05/2019 20:04   DG Foot Complete Left  Result Date: 05/05/2019 CLINICAL DATA:  Fall down steps.  Left foot and ankle pain EXAM: LEFT FOOT - COMPLETE 3+ VIEW COMPARISON:  None. FINDINGS: There is no evidence of fracture or dislocation. There is no evidence of arthropathy or other focal bone abnormality. Soft tissues are unremarkable. IMPRESSION: Negative. Electronically Signed   By: Charlett Nose M.D.   On: 05/05/2019 20:04     ____________________________________________   PROCEDURES Procedures  ____________________________________________  DIFFERENTIAL DIAGNOSIS   Foot fracture, ankle fracture, hip/pelvis fracture  CLINICAL IMPRESSION / ASSESSMENT AND PLAN / ED COURSE  Medications ordered in the ED: Medications  HYDROmorphone (DILAUDID) injection 0.5 mg (0.5 mg Intramuscular Given 05/05/19 2001)  acetaminophen (TYLENOL) tablet 1,000 mg (1,000 mg Oral Given 05/05/19 2001)    Pertinent labs & imaging results that were available during my care of the patient were reviewed by me and considered in my medical decision making (see chart for details).  Kelly Huber was evaluated in Emergency Department on 05/05/2019 for the symptoms described in the history of present illness. She was evaluated in the context of the global COVID-19 pandemic, which necessitated consideration that the patient might be at risk for infection with the SARS-CoV-2 virus that causes COVID-19. Institutional protocols and algorithms that pertain to the evaluation of patients at risk for COVID-19 are in a state of rapid change based on information released by regulatory bodies including the CDC and federal and state organizations. These policies and algorithms were followed during the patient's care in the ED.   Patient [redacted] weeks pregnant presents with severe left leg pain after a mechanical fall down some steps.  Discussed with OB who advised that it is okay to do hip and pelvis x-rays to rule out injury but recommend shielding the abdomen is much as possible.  Relayed this to radiology, advised that it is okay to get a suboptimal study as the clinical area of concern is the pubic rami, acetabulum, femoral neck.  Doubt spinal injury.  We will also get x-rays of ankle and foot.  After ED evaluation is complete, will be agrees that patient should go to labor and delivery for NST.  Clinical Course as of May 05 2015  Wed May 05, 2019   0865 After further consideration, patient declines to have x-rays of the hip and pelvis.   [PS]  2015 X-ray images viewed by me, no acute fractures or dislocation.  Radiology report confirms x-rays are unremarkable.   [PS]  2015 Pain improved, will plan to discharge her emergency department to follow-up at labor and delivery for NST.   [PS]    Clinical Course User Index [PS] Sharman Cheek, MD  ____________________________________________   FINAL CLINICAL IMPRESSION(S) / ED DIAGNOSES    Final diagnoses:  Third trimester pregnancy  Fall in home, initial encounter  Left leg pain     ED Discharge Orders    None      Portions of this note were generated with dragon dictation software. Dictation errors may occur despite best attempts at proofreading.   Sharman Cheek, MD 05/05/19 2016

## 2019-05-06 DIAGNOSIS — O10913 Unspecified pre-existing hypertension complicating pregnancy, third trimester: Secondary | ICD-10-CM

## 2019-05-06 DIAGNOSIS — O99323 Drug use complicating pregnancy, third trimester: Secondary | ICD-10-CM

## 2019-05-06 DIAGNOSIS — Z3A30 30 weeks gestation of pregnancy: Secondary | ICD-10-CM

## 2019-05-06 DIAGNOSIS — O99013 Anemia complicating pregnancy, third trimester: Secondary | ICD-10-CM

## 2019-05-06 DIAGNOSIS — O9A213 Injury, poisoning and certain other consequences of external causes complicating pregnancy, third trimester: Secondary | ICD-10-CM

## 2019-05-06 DIAGNOSIS — W1831XA Fall on same level due to stepping on an object, initial encounter: Secondary | ICD-10-CM

## 2019-05-06 MED ORDER — OXYCODONE HCL 5 MG PO TABS
5.0000 mg | ORAL_TABLET | Freq: Once | ORAL | Status: AC
  Administered 2019-05-06: 5 mg via ORAL
  Filled 2019-05-06: qty 1

## 2019-05-06 NOTE — Discharge Instructions (Signed)
Preeclampsia and Eclampsia Preeclampsia is a serious condition that may develop during pregnancy. This condition causes high blood pressure and increased protein in your urine along with other symptoms, such as headaches and vision changes. These symptoms may develop as the condition gets worse. Preeclampsia may occur at 20 weeks of pregnancy or later. Diagnosing and treating preeclampsia early is very important. If not treated early, it can cause serious problems for you and your baby. One problem it can lead to is eclampsia. Eclampsia is a condition that causes muscle jerking or shaking (convulsions or seizures) and other serious problems for the mother. During pregnancy, delivering your baby may be the best treatment for preeclampsia or eclampsia. For most women, preeclampsia and eclampsia symptoms go away after giving birth. In rare cases, a woman may develop preeclampsia after giving birth (postpartum preeclampsia). This usually occurs within 48 hours after childbirth but may occur up to 6 weeks after giving birth. What are the causes? The cause of preeclampsia is not known. What increases the risk? The following risk factors make you more likely to develop preeclampsia:  Being pregnant for the first time.  Having had preeclampsia during a past pregnancy.  Having a family history of preeclampsia.  Having high blood pressure.  Being pregnant with more than one baby.  Being 35 or older.  Being African-American.  Having kidney disease or diabetes.  Having medical conditions such as lupus or blood diseases.  Being very overweight (obese). What are the signs or symptoms? The most common symptoms are:  Severe headaches.  Vision problems, such as blurred or double vision.  Abdominal pain, especially upper abdominal pain. Other symptoms that may develop as the condition gets worse include:  Sudden weight gain.  Sudden swelling of the hands, face, legs, and feet.  Severe nausea  and vomiting.  Numbness in the face, arms, legs, and feet.  Dizziness.  Urinating less than usual.  Slurred speech.  Convulsions or seizures. How is this diagnosed? There are no screening tests for preeclampsia. Your health care provider will ask you about symptoms and check for signs of preeclampsia during your prenatal visits. You may also have tests that include:  Checking your blood pressure.  Urine tests to check for protein. Your health care provider will check for this at every prenatal visit.  Blood tests.  Monitoring your baby's heart rate.  Ultrasound. How is this treated? You and your health care provider will determine the treatment approach that is best for you. Treatment may include:  Having more frequent prenatal exams to check for signs of preeclampsia, if you have an increased risk for preeclampsia.  Medicine to lower your blood pressure.  Staying in the hospital, if your condition is severe. There, treatment will focus on controlling your blood pressure and the amount of fluids in your body (fluid retention).  Taking medicine (magnesium sulfate) to prevent seizures. This may be given as an injection or through an IV.  Taking a low-dose aspirin during your pregnancy.  Delivering your baby early. You may have your labor started with medicine (induced), or you may have a cesarean delivery. Follow these instructions at home: Eating and drinking   Drink enough fluid to keep your urine pale yellow.  Avoid caffeine. Lifestyle  Do not use any products that contain nicotine or tobacco, such as cigarettes and e-cigarettes. If you need help quitting, ask your health care provider.  Do not use alcohol or drugs.  Avoid stress as much as possible. Rest and get   plenty of sleep. General instructions  Take over-the-counter and prescription medicines only as told by your health care provider.  When lying down, lie on your left side. This keeps pressure off your  major blood vessels.  When sitting or lying down, raise (elevate) your feet. Try putting some pillows underneath your lower legs.  Exercise regularly. Ask your health care provider what kinds of exercise are best for you.  Keep all follow-up and prenatal visits as told by your health care provider. This is important. How is this prevented? There is no known way of preventing preeclampsia or eclampsia from developing. However, to lower your risk of complications and detect problems early:  Get regular prenatal care. Your health care provider may be able to diagnose and treat the condition early.  Maintain a healthy weight. Ask your health care provider for help managing weight gain during pregnancy.  Work with your health care provider to manage any long-term (chronic) health conditions you have, such as diabetes or kidney problems.  You may have tests of your blood pressure and kidney function after giving birth.  Your health care provider may have you take low-dose aspirin during your next pregnancy. Contact a health care provider if:  You have symptoms that your health care provider told you may require more treatment or monitoring, such as: ? Headaches. ? Nausea or vomiting. ? Abdominal pain. ? Dizziness. ? Light-headedness. Get help right away if:  You have severe: ? Abdominal pain. ? Headaches that do not get better. ? Dizziness. ? Vision problems. ? Confusion. ? Nausea or vomiting.  You have any of the following: ? A seizure. ? Sudden, rapid weight gain. ? Sudden swelling in your hands, ankles, or face. ? Trouble moving any part of your body. ? Numbness in any part of your body. ? Trouble speaking. ? Abnormal bleeding.  You faint. Summary  Preeclampsia is a serious condition that may develop during pregnancy.  This condition causes high blood pressure and increased protein in your urine along with other symptoms, such as headaches and vision  changes.  Diagnosing and treating preeclampsia early is very important. If not treated early, it can cause serious problems for you and your baby.  Get help right away if you have symptoms that your health care provider told you to watch for. This information is not intended to replace advice given to you by your health care provider. Make sure you discuss any questions you have with your health care provider. Document Released: 05/03/2000 Document Revised: 01/06/2018 Document Reviewed: 12/11/2015 Elsevier Patient Education  2020 Elsevier Inc.   Fetal Movement Counts Patient Name: ________________________________________________ Patient Due Date: ____________________ What is a fetal movement count?  A fetal movement count is the number of times that you feel your baby move during a certain amount of time. This may also be called a fetal kick count. A fetal movement count is recommended for every pregnant woman. You may be asked to start counting fetal movements as early as week 28 of your pregnancy. Pay attention to when your baby is most active. You may notice your baby's sleep and wake cycles. You may also notice things that make your baby move more. You should do a fetal movement count:  When your baby is normally most active.  At the same time each day. A good time to count movements is while you are resting, after having something to eat and drink. How do I count fetal movements? 1. Find a quiet, comfortable area.  Sit, or lie down on your side. 2. Write down the date, the start time and stop time, and the number of movements that you felt between those two times. Take this information with you to your health care visits. 3. For 2 hours, count kicks, flutters, swishes, rolls, and jabs. You should feel at least 10 movements during 2 hours. 4. You may stop counting after you have felt 10 movements. 5. If you do not feel 10 movements in 2 hours, have something to eat and drink. Then, keep  resting and counting for 1 hour. If you feel at least 4 movements during that hour, you may stop counting. Contact a health care provider if:  You feel fewer than 4 movements in 2 hours.  Your baby is not moving like he or she usually does. Date: ____________ Start time: ____________ Stop time: ____________ Movements: ____________ Date: ____________ Start time: ____________ Stop time: ____________ Movements: ____________ Date: ____________ Start time: ____________ Stop time: ____________ Movements: ____________ Date: ____________ Start time: ____________ Stop time: ____________ Movements: ____________ Date: ____________ Start time: ____________ Stop time: ____________ Movements: ____________ Date: ____________ Start time: ____________ Stop time: ____________ Movements: ____________ Date: ____________ Start time: ____________ Stop time: ____________ Movements: ____________ Date: ____________ Start time: ____________ Stop time: ____________ Movements: ____________ Date: ____________ Start time: ____________ Stop time: ____________ Movements: ____________ This information is not intended to replace advice given to you by your health care provider. Make sure you discuss any questions you have with your health care provider. Document Released: 06/05/2006 Document Revised: 05/26/2018 Document Reviewed: 06/15/2015 Elsevier Patient Education  [place].  Can apply Unkers ointment on sore muscles. Can take Tylenol ES 1000 mgm (2 tablets) every 6 hours for pain. Can apply ice or heat to sore muscles  Eat or drink 8 oz of OJ, or a baked potato or sweet potato or a banana daily for potassium.

## 2019-05-06 NOTE — Final Progress Note (Signed)
Physician Final Progress Note  Patient ID: Phinley Schall MRN: [id number] DOB/AGE: 12/20/1975 43 y.o.  Admit date: 05/05/2019 Admitting provider: Malachy Mood, MD/ Jesus Genera. Danise Mina, [place] Discharge date: 05/06/2019   Admission Diagnoses: IUP at [facility] s/p fall down stairs.  Discharge Diagnoses:  Active Problems:   Fall (on) (from) other stairs and steps, initial encounter Reactive NST  Consults: None  Significant Findings/ Diagnostic Studies:     Morgan Keinath is a 43 y.o. [phone] female with EDC=07/11/2018 at [redacted]w[redacted]d dated by an 8wk3d ultrasound.  Her pregnancy has been complicated by chronic hypertension (takes labetalol 100 mgm daily), AMA (diploid XY), marijuana use, and anemia. She has a history of PPD and preeclampsia.  She presents to L&D for fetal monitoring after being seen in the ER after falling down a flight of stairs (she tripped on a broom) this evening at 1815.  She complain of pain in the left groin, the left side of her back extending from the thoracic area down to her left hip and left thigh. She had X rays of her left foot in the ER and that was negative for a fracture She reports feeling a couple of contractions in the ER and after she presented to L&D. No vaginal bleeding. Feeling fetal movement.    Prenatal care site: Prenatal care at [facility] has been remarkable for   Clinic [facility] Prenatal Labs  Dating 8wk [place] Blood type: O/Positive/-- (07/08 1051)   Genetic Screen AFP:     NIPS: diploid XY Antibody:Negative (07/08 1051)  Anatomic [place] Normal Rubella: 2.53 (07/08 1051) Varicella:  Immune  GTT Third trimester: 121 RPR: Non Reactive (07/08 1051)   Rhogam Not needed HBsAg: Negative (07/08 1051)   TDaP vaccine                       Flu Shot: HIV: Non Reactive (07/08 1051)   Baby Food                                GBS:   Contraception  Pap: NIL 2020  CBB   Pelvis tested to 7lbs 2oz  CS/VBAC N/A   Support Person Kelley           Maternal  Medical History:   Past Medical History:  Diagnosis Date  . Advanced maternal age in multigravida   . Alcohol intoxication with delirium ([place]) 12/28/2016  . Chronic hypertension   . Gestational diabetes   . Gestational hypertension 02/29/2016  . History of postpartum depression   . Marijuana use     Past Surgical History:  Procedure Laterality Date  . cataract surgery    . dilation and currette      Allergies  Allergen Reactions  . Codeine Hives and Shortness Of Breath  . Penicillins Hives and Shortness Of Breath   Medications:   No current facility-administered medications on file prior to encounter.   Current Outpatient Medications on File Prior to Encounter  Medication Sig Dispense Refill  . aspirin 81 MG chewable tablet Chew 81 mg by mouth daily.    . ferrous sulfate (FERROUSUL) 325 (65 FE) MG tablet Take 1 tablet (325 mg total) by mouth daily with breakfast. 60 tablet 1  . LABETALOL HCL PO Take 100 mg by mouth daily.     Marland Kitchen omeprazole (PRILOSEC) 20 MG capsule Take 1 capsule (20 mg total) by mouth daily. 30 capsule 11  .  prenatal vitamin w/FE, FA (PRENATAL 1 + 1) 27-1 MG TABS tablet Prenatal Plus (calcium carbonate) 27 mg iron-1 mg tablet  TAKE 1 TABLET BY MOUTH DAILY WHILE TRYING TO CONCEIVE, PREGNANT & BREASFEEDING    . triamcinolone cream (KENALOG) 0.5 % Apply 1 application topically 3 (three) times daily as needed.    . cetirizine (ZYRTEC) 10 MG tablet Take 10 mg by mouth daily.    Marland Kitchen doxylamine, Sleep, (UNISOM) 25 MG tablet Take 25 mg by mouth at bedtime as needed.    . fluticasone (FLONASE) 50 MCG/ACT nasal spray Place into both nostrils daily.    . hydrocortisone (ANUSOL-HC) 2.5 % rectal cream Place 1 application rectally 2 (two) times daily. 30 g 0  .       Social History: She  reports that she quit smoking about 3 years ago. Her smoking use included cigarettes. She has a 5.00 pack-year smoking history. She has never used smokeless tobacco. She reports current drug  use. Drug: Marijuana. She reports that she does not drink alcohol.  Family History: family history includes Bipolar disorder in her brother and father; Hypertension in her mother; Schizophrenia in her father.   Review of Systems: Negative x 10 systems reviewed except as noted in the HPI.      Physical Exam:  Vital Signs: BP 109/66 Pulse 83   Temp 98.2 F (36.8 C) (Oral)   Resp 18   LMP 09/11/2018 (Exact Date)  Patient Vitals for the past 24 hrs:  BP Temp Temp src Pulse Resp  05/06/19 0009 109/66 -- -- 83 --  05/05/19 2318 (!) 132/95 -- -- 74 --  05/05/19 2206 (!) 142/77 -- -- 70 --  05/05/19 2154 (!) 156/90 -- -- 69 --  05/05/19 2132 (!) 164/99 98.2 F (36.8 C) Oral 72 18     General: gravid BF, appears uncomfortable especially when turning in bed. HEENT: normocephalic, atraumatic Heart: regular rate  Lungs: normal respiratory effort Abdomen: soft, gravid, TTP in left groin area Back: no bruising seen on left side of back or hip, but tender to touch. Extremities: non-tender, symmetric, no edema bilaterally.    Neurologic: Alert & oriented x 3.   Baseline FHR: baseline 135 with accelerations to 150s, moderate variability Toco: rare contraction Bedside Ultrasound:  Presentation: variable (cephalic/ transverse)  Placenta: anterior, appears normal  +FM noted  Results for orders placed or performed during the hospital encounter of 05/05/19 (from the past 24 hour(s))  CBC     Status: Abnormal   Collection Time: 05/05/19 11:12 PM  Result Value Ref Range   WBC 18.9 (H) 4.0 - 10.5 K/uL   RBC 3.41 (L) 3.87 - 5.11 MIL/uL   Hemoglobin 10.0 (L) 12.0 - 15.0 g/dL   HCT 21.3 (L) 08.6 - 57.8 %   MCV 87.1 80.0 - 100.0 fL   MCH 29.3 26.0 - 34.0 pg   MCHC 33.7 30.0 - 36.0 g/dL   RDW 46.9 62.9 - 52.8 %   Platelets 407 (H) 150 - 400 K/uL   nRBC 0.0 0.0 - 0.2 %  Comprehensive metabolic panel     Status: Abnormal   Collection Time: 05/05/19 11:12 PM  Result Value Ref Range   Sodium  132 (L) 135 - 145 mmol/L   Potassium 3.3 (L) 3.5 - 5.1 mmol/L   Chloride 105 98 - 111 mmol/L   CO2 17 (L) 22 - 32 mmol/L   Glucose, Bld 110 (H) 70 - 99 mg/dL   BUN 9 6 -  20 mg/dL   Creatinine, Ser 1.61 0.44 - 1.00 mg/dL   Calcium 9.1 8.9 - 09.6 mg/dL   Total Protein 7.4 6.5 - 8.1 g/dL   Albumin 3.3 (L) 3.5 - 5.0 g/dL   AST 17 15 - 41 U/L   ALT 12 0 - 44 U/L   Alkaline Phosphatase 100 38 - 126 U/L   Total Bilirubin 0.3 0.3 - 1.2 mg/dL   GFR calc non Af Amer >60 >60 mL/min   GFR calc Af Amer >60 >60 mL/min   Anion gap 10 5 - 15  APTT     Status: None   Collection Time: 05/05/19 11:12 PM  Result Value Ref Range   aPTT 30 24 - 36 seconds  Protime-INR     Status: None   Collection Time: 05/05/19 11:12 PM  Result Value Ref Range   Prothrombin Time 12.1 11.4 - 15.2 seconds   INR 0.9 0.8 - 1.2  Fibrinogen     Status: Abnormal   Collection Time: 05/05/19 11:12 PM  Result Value Ref Range   Fibrinogen 664 (H) 210 - 475 mg/dL  Protein / creatinine ratio, urine     Status: None   Collection Time: 05/05/19 11:19 PM  Result Value Ref Range   Creatinine, Urine 218 mg/dL   Total Protein, Urine 33 mg/dL   Protein Creatinine Ratio 0.15 0.00 - 0.15 mg/mg[Cre]   Assessment:  Slyvia Lartigue is a 43 y.o. [id number] female at [redacted]w[redacted]d, s/p fall, with no evidence of fetal compromise. Reactive NST. Negative abruption labs. Acontractile FKC instructions given.  CHTN:: initially had elevated blood pressures, possibly due to pain and anxiety. Preeclampsia labs were normal. Given 100 mgm labetalol po here and blood pressure prior to discharge was normal.  Patient to call [facility] if BPs 140/90 or greater at home.   Muscular pain from the fall: given one 5 mgm roxicodone for pain prior to discharge. Recommend Tylenol and Unkers salve, ice or heat for pain.   Mild hypokalemia: Discussed eating foods high in potassium daily.   Follow up appointment 23 Dec as scheduled or sooner prn.    Procedures:  NST  Discharge Condition: stable  Disposition: Discharge disposition: 01-Home or Self Care       Diet: Regular diet  Discharge Activity: Ambulate in house  Discharge Instructions    Discharge patient   Complete by: As directed    Discharge disposition: 01-Home or Self Care   Discharge patient date: 05/06/2019     Allergies as of 05/06/2019      Reactions   Codeine Hives, Shortness Of Breath   Penicillins Hives, Shortness Of Breath      Medication List    TAKE these medications   aspirin 81 MG chewable tablet Chew 81 mg by mouth daily.   cetirizine 10 MG tablet Commonly known as: ZYRTEC Take 10 mg by mouth daily.   doxylamine (Sleep) 25 MG tablet Commonly known as: UNISOM Take 25 mg by mouth at bedtime as needed.   ferrous sulfate 325 (65 FE) MG tablet Commonly known as: FerrouSul Take 1 tablet (325 mg total) by mouth daily with breakfast.   fluticasone 50 MCG/ACT nasal spray Commonly known as: FLONASE Place into both nostrils daily.   hydrocortisone 2.5 % rectal cream Commonly known as: Anusol-HC Place 1 application rectally 2 (two) times daily.   LABETALOL HCL PO Take 100 mg by mouth daily.   nicotine 14 mg/24hr patch Commonly known as: NICODERM CQ - dosed in mg/24 hours  Place 14 mg onto the skin daily.   omeprazole 20 MG capsule Commonly known as: PRILOSEC Take 1 capsule (20 mg total) by mouth daily.   prenatal vitamin w/FE, FA 27-1 MG Tabs tablet Prenatal Plus (calcium carbonate) 27 mg iron-1 mg tablet  TAKE 1 TABLET BY MOUTH DAILY WHILE TRYING TO CONCEIVE, PREGNANT & BREASFEEDING   triamcinolone cream 0.5 % Commonly known as: KENALOG Apply 1 application topically 3 (three) times daily as needed.        Total time spent taking care of this patient: 30 minutes  Signed: Farrel Connersolleen Denette Hass 05/06/2019, 12:53 AM

## 2019-05-12 ENCOUNTER — Encounter: Payer: Self-pay | Admitting: Advanced Practice Midwife

## 2019-05-12 ENCOUNTER — Ambulatory Visit (INDEPENDENT_AMBULATORY_CARE_PROVIDER_SITE_OTHER): Payer: Medicaid Other

## 2019-05-12 ENCOUNTER — Ambulatory Visit (INDEPENDENT_AMBULATORY_CARE_PROVIDER_SITE_OTHER): Payer: Medicaid Other | Admitting: Advanced Practice Midwife

## 2019-05-12 ENCOUNTER — Other Ambulatory Visit: Payer: Self-pay

## 2019-05-12 VITALS — BP 128/88 | Wt 184.0 lb

## 2019-05-12 DIAGNOSIS — O09529 Supervision of elderly multigravida, unspecified trimester: Secondary | ICD-10-CM

## 2019-05-12 DIAGNOSIS — Z8659 Personal history of other mental and behavioral disorders: Secondary | ICD-10-CM

## 2019-05-12 DIAGNOSIS — Z3A31 31 weeks gestation of pregnancy: Secondary | ICD-10-CM

## 2019-05-12 DIAGNOSIS — O0990 Supervision of high risk pregnancy, unspecified, unspecified trimester: Secondary | ICD-10-CM

## 2019-05-12 DIAGNOSIS — O169 Unspecified maternal hypertension, unspecified trimester: Secondary | ICD-10-CM

## 2019-05-12 DIAGNOSIS — Z362 Encounter for other antenatal screening follow-up: Secondary | ICD-10-CM | POA: Diagnosis not present

## 2019-05-12 DIAGNOSIS — O0943 Supervision of pregnancy with grand multiparity, third trimester: Secondary | ICD-10-CM

## 2019-05-12 DIAGNOSIS — O0940 Supervision of pregnancy with grand multiparity, unspecified trimester: Secondary | ICD-10-CM

## 2019-05-12 NOTE — Progress Notes (Signed)
No vb. No lof.  

## 2019-05-12 NOTE — Progress Notes (Signed)
Routine Prenatal Care Visit  Subjective  Kelly Huber is a 43 y.o. [id number] at [redacted]w[redacted]d being seen today for ongoing prenatal care.  She is currently monitored for the following issues for this high-risk pregnancy and has Marijuana abuse; HTN (hypertension); Supervision of high risk pregnancy, antepartum; Hx of preeclampsia, prior pregnancy, currently pregnant; Antepartum multigravida of advanced maternal age; Lumbar radiculopathy; [place] multiparity with antenatal problem, antepartum; Hypertension affecting pregnancy, antepartum; History of postpartum depression, currently pregnant; Anemia during pregnancy in third trimester; and Fall (on) (from) other stairs and steps, initial encounter on their problem list.  ----------------------------------------------------------------------------------- Patient reports general abdominal and pelvic pain since falling last week.   Contractions: Not present. Vag. Bleeding: None.  Movement: Present. Leaking Fluid denies.  ----------------------------------------------------------------------------------- The following portions of the patient's history were reviewed and updated as appropriate: allergies, current medications, past family history, past medical history, past social history, past surgical history and problem list. Problem list updated.  Objective  Blood pressure 128/88, weight 184 lb (83.5 kg), last menstrual period 09/11/2018 Pregravid weight 158 lb (71.7 kg) Total Weight Gain 26 lb (11.8 kg) Urinalysis: Urine Protein    Urine Glucose    Fetal Status: Fetal Heart Rate (bpm): 140 Fundal Height: 32 cm Movement: Present  Presentation: Oblique  Growth: 50.3%, 3#14oz, AC 84.5%, AFI: 22.4 cm  General:  Alert, oriented and cooperative. Patient is in no acute distress.  Skin: Skin is warm and dry. No rash noted.   Cardiovascular: Normal heart rate noted  Respiratory: Normal respiratory effort, no problems with respiration noted  Abdomen: Soft,  gravid, appropriate for gestational age. Pain/Pressure: Absent     Pelvic:  Cervical exam performed      closed/thick/high  Extremities: Normal range of motion.  Edema: None  Mental Status: Normal mood and affect. Normal behavior. Normal judgment and thought content.   Assessment   43 y.o. [id number] at [redacted]w[redacted]d by  07/12/2019, by Ultrasound presenting for routine prenatal visit  Plan   NINTH Problems (from 11/25/18 to present)    Problem Noted Resolved   [place] multiparity with antenatal problem, antepartum 02/12/2019 by Malachy Mood, MD No   Overview Signed 02/12/2019  5:14 PM by Malachy Mood, MD    Increased risk of uterine atony and postpartum hemorrhage      Hypertension affecting pregnancy, antepartum 02/12/2019 by Malachy Mood, MD No   Overview Signed 02/12/2019  5:17 PM by Malachy Mood, MD    [X]  Aspirin 81 mg daily after 12 weeks; discontinue after 36 weeks [X]  baseline labs with CBC, CMP, urine protein/creatinine ratio [X]  no BP meds unless BPs become elevated - on labetalol 100mg  bid [ ]  ultrasound for growth at 28, 32, 36 weeks  Current antihypertensives:  Labetalol   Baseline and surveillance labs (pulled in from [facility], refresh links as needed)  Lab Results  Component Value Date   PLT 389 11/25/2018   CREATININE 0.88 12/20/2018   AST 54 (H) 12/17/2017   ALT 53 (H) 12/17/2017   PROTCRRATIO 0.29 (H) 02/28/2016   LABPROT 11.8 12/28/2016   PROTEIN24HR 110 12/20/2018    Antenatal Testing CHTN - O10.919  Group I  BP < 140/90, no preeclampsia, AGA,  nml AFV, +/- meds    Group II BP > 140/90, on meds, no preeclampsia, AGA, nml AFV  20-28-34-38  20-24-28-32-35-38  32//2 x wk  28//BPP wkly then 32//2 x wk  40 no meds; 39 meds  PRN or 37  Pre-eclampsia  GHTN - O13.9/Preeclampsia without severe features  -  O14.00   Preeclampsia with severe features - O14.10  Q 3-4wks  Q 2 wks  28//BPP wkly then 32//2 x wk  Inpatient  37  PRN or 34          History of postpartum depression, currently pregnant 02/12/2019 by Vena Austria, MD No   Supervision of high risk pregnancy, antepartum 11/25/2018 by Oswaldo Conroy, CNM No   Overview Addendum 02/12/2019  5:13 PM by Vena Austria, MD    [facility] Prenatal Labs  Dating 8wk [place] Blood type: O/Positive/-- (07/08 1051)   Genetic Screen AFP:     NIPS: diploid XY Antibody:Negative (07/08 1051)  Anatomic [place] Normal Rubella: 2.53 (07/08 1051) Varicella:  Immune  GTT Third trimester:  RPR: Non Reactive (07/08 1051)   Rhogam Not needed HBsAg: Negative (07/08 1051)   TDaP vaccine                       Flu Shot: HIV: Non Reactive (07/08 1051)   Baby Food                                GBS:   Contraception  Pap: NIL 2020  CBB   Pelvis tested to 7lbs 2oz  CS/VBAC N/A   Support Person Ahmad             Hx of preeclampsia, prior pregnancy, currently pregnant 11/25/2018 by Oswaldo Conroy, CNM No       Preterm labor symptoms and general obstetric precautions including but not limited to vaginal bleeding, contractions, leaking of fluid and fetal movement were reviewed in detail with the patient.   Return in about 2 weeks (around 05/26/2019) for afi/nst/rob.  Tresea Mall, CNM 05/12/2019 5:18 PM

## 2019-05-18 ENCOUNTER — Ambulatory Visit (INDEPENDENT_AMBULATORY_CARE_PROVIDER_SITE_OTHER): Payer: Medicaid Other | Admitting: Obstetrics and Gynecology

## 2019-05-18 ENCOUNTER — Ambulatory Visit (INDEPENDENT_AMBULATORY_CARE_PROVIDER_SITE_OTHER): Payer: Medicaid Other

## 2019-05-18 ENCOUNTER — Observation Stay
Admission: EM | Admit: 2019-05-18 | Discharge: 2019-05-18 | Disposition: A | Discharge status: Home / Self Care | Payer: Medicaid Other | Attending: Obstetrics & Gynecology | Admitting: Obstetrics & Gynecology

## 2019-05-18 ENCOUNTER — Encounter: Payer: Self-pay | Admitting: Obstetrics & Gynecology

## 2019-05-18 ENCOUNTER — Other Ambulatory Visit: Payer: Self-pay

## 2019-05-18 VITALS — BP 150/98 | Wt 186.0 lb

## 2019-05-18 DIAGNOSIS — Z3A32 32 weeks gestation of pregnancy: Secondary | ICD-10-CM | POA: Insufficient documentation

## 2019-05-18 DIAGNOSIS — Z3689 Encounter for other specified antenatal screening: Secondary | ICD-10-CM

## 2019-05-18 DIAGNOSIS — Z880 Allergy status to penicillin: Secondary | ICD-10-CM | POA: Diagnosis not present

## 2019-05-18 DIAGNOSIS — O09299 Supervision of pregnancy with other poor reproductive or obstetric history, unspecified trimester: Secondary | ICD-10-CM

## 2019-05-18 DIAGNOSIS — O0990 Supervision of high risk pregnancy, unspecified, unspecified trimester: Secondary | ICD-10-CM

## 2019-05-18 DIAGNOSIS — Z79899 Other long term (current) drug therapy: Secondary | ICD-10-CM | POA: Insufficient documentation

## 2019-05-18 DIAGNOSIS — O10913 Unspecified pre-existing hypertension complicating pregnancy, third trimester: Secondary | ICD-10-CM | POA: Diagnosis not present

## 2019-05-18 DIAGNOSIS — O99891 Other specified diseases and conditions complicating pregnancy: Secondary | ICD-10-CM

## 2019-05-18 DIAGNOSIS — O169 Unspecified maternal hypertension, unspecified trimester: Secondary | ICD-10-CM

## 2019-05-18 DIAGNOSIS — Z8659 Personal history of other mental and behavioral disorders: Secondary | ICD-10-CM

## 2019-05-18 DIAGNOSIS — Z885 Allergy status to narcotic agent status: Secondary | ICD-10-CM | POA: Insufficient documentation

## 2019-05-18 DIAGNOSIS — O0993 Supervision of high risk pregnancy, unspecified, third trimester: Secondary | ICD-10-CM

## 2019-05-18 DIAGNOSIS — O0940 Supervision of pregnancy with grand multiparity, unspecified trimester: Secondary | ICD-10-CM

## 2019-05-18 DIAGNOSIS — O99013 Anemia complicating pregnancy, third trimester: Secondary | ICD-10-CM | POA: Insufficient documentation

## 2019-05-18 DIAGNOSIS — O0943 Supervision of pregnancy with grand multiparity, third trimester: Secondary | ICD-10-CM | POA: Insufficient documentation

## 2019-05-18 DIAGNOSIS — Z7982 Long term (current) use of aspirin: Secondary | ICD-10-CM | POA: Insufficient documentation

## 2019-05-18 DIAGNOSIS — O163 Unspecified maternal hypertension, third trimester: Secondary | ICD-10-CM

## 2019-05-18 LAB — COMPREHENSIVE METABOLIC PANEL
ALT: 11 U/L (ref 0–44)
AST: 17 U/L (ref 15–41)
Albumin: 3.1 g/dL — ABNORMAL LOW (ref 3.5–5.0)
Alkaline Phosphatase: 92 U/L (ref 38–126)
Anion gap: 9 (ref 5–15)
BUN: 11 mg/dL (ref 6–20)
CO2: 18 mmol/L — ABNORMAL LOW (ref 22–32)
Calcium: 8.9 mg/dL (ref 8.9–10.3)
Chloride: 106 mmol/L (ref 98–111)
Creatinine, Ser: 0.64 mg/dL (ref 0.44–1.00)
GFR calc Af Amer: >60 mL/min (ref >60)
GFR calc non Af Amer: >60 mL/min (ref >60)
Glucose, Bld: 96 mg/dL (ref 70–99)
Potassium: 3.5 mmol/L (ref 3.5–5.1)
Sodium: 133 mmol/L — ABNORMAL LOW (ref 135–145)
Total Bilirubin: 0.5 mg/dL (ref 0.3–1.2)
Total Protein: 6.9 g/dL (ref 6.5–8.1)

## 2019-05-18 LAB — CBC
HCT: 28.9 % — ABNORMAL LOW (ref 36.0–46.0)
Hemoglobin: 9.9 g/dL — ABNORMAL LOW (ref 12.0–15.0)
MCH: 29.9 pg (ref 26.0–34.0)
MCHC: 34.3 g/dL (ref 30.0–36.0)
MCV: 87.3 fL (ref 80.0–100.0)
Platelets: 348 10*3/uL (ref 150–400)
RBC: 3.31 MIL/uL — ABNORMAL LOW (ref 3.87–5.11)
RDW: 14.1 % (ref 11.5–15.5)
WBC: 15.5 10*3/uL — ABNORMAL HIGH (ref 4.0–10.5)
nRBC: 0 % (ref 0.0–0.2)

## 2019-05-18 LAB — POCT URINALYSIS DIPSTICK OB: Glucose, UA: NEGATIVE

## 2019-05-18 LAB — PROTEIN / CREATININE RATIO, URINE
Creatinine, Urine: 115 mg/dL
Protein Creatinine Ratio: 0.17 mg/mg{Cre} — ABNORMAL HIGH (ref 0.00–0.15)
Total Protein, Urine: 20 mg/dL

## 2019-05-18 MED ORDER — ONDANSETRON HCL 4 MG/2ML IJ SOLN: 4.0000 mg | Freq: Four times a day (QID) | INTRAMUSCULAR | Status: DC | PRN

## 2019-05-18 MED ORDER — ACETAMINOPHEN 325 MG PO TABS: 650.0000 mg | ORAL_TABLET | ORAL | Status: DC | PRN

## 2019-05-18 MED ORDER — LIDOCAINE HCL (PF) 1 % IJ SOLN: 30.0000 mL | INTRAMUSCULAR | Status: DC | PRN

## 2019-05-18 NOTE — Discharge Instructions (Signed)
Nonstress Test °A nonstress test is a procedure that is done during pregnancy in order to check the baby's heartbeat. The procedure can help show if the baby (fetus) is healthy. It is commonly done when: °· The baby is past his or her due date. °· The pregnancy is high risk. °· The baby is moving less than normal. °· The mother has lost a pregnancy in the past. °· The health care provider suspects a problem with the baby's growth. °· There is too much or too little amniotic fluid. °The procedure is often done in the third trimester of pregnancy to find out if an early delivery is needed and whether such a delivery is safe. °During a nonstress test, the baby's heartbeat is monitored when the baby is resting and when the baby is moving. If the baby is healthy, the heart rate will increase when he or she moves or kicks and will return to normal when he or she rests. °Tell a health care provider about: °· Any allergies you have. °· Any medical conditions you have. °· All medicines you are taking, including vitamins, herbs, eye drops, creams, and over-the-counter medicines. °What are the risks? °There are no risks to you or your baby from a nonstress test. This procedure should not be painful or uncomfortable. °What happens before the procedure? °· Eat a meal right before the test or as directed by your health care provider. Food may help encourage the baby to move. °· Use the restroom right before the test. °What happens during the procedure? °· Two monitors will be placed on your abdomen. One will record the baby's heart rate and the other will record the contractions of your uterus. °· You may be asked to lie down on your side or to sit upright. °· You may be given a button to press when you feel your baby move. °· Your health care provider will listen to your baby's heartbeat and recorded it. He or she may also watch your baby's heartbeat on a screen. °· If the baby seems to be sleeping, you may be asked to drink  some juice or soda, eat a snack, or change positions. °The procedure may vary among health care providers and hospitals. °What happens after the procedure? °· Your health care provider will discuss the test results with you and make recommendations for the future. Depending on the results, your health care provider may order additional tests or another course of action. °· If your health care provider gave you any diet or activity instructions, make sure to follow them. °· Keep all follow-up visits as told by your health care provider. This is important. °Summary °· A nonstress test is a procedure that is done during pregnancy in order to check the baby's heartbeat. The procedure can help show if the baby is healthy. °· The procedure is often done in the third trimester of pregnancy to find out if an early delivery is needed and whether such a delivery is safe. °· During a nonstress test, the baby's heartbeat is monitored when the baby is resting and when the baby is moving. If the baby is healthy, the heart rate will increase when he or she moves or kicks and will return to normal when he or she rests. °· Your health care provider will discuss the test results with you and make recommendations for the future. °This information is not intended to replace advice given to you by your health care provider. Make sure you discuss any   questions you have with your health care provider. °Document Released: 04/26/2002 Document Revised: 08/15/2016 Document Reviewed: 08/15/2016 °Elsevier Patient Education © 2020 [employer] Inc. ° °

## 2019-05-18 NOTE — OB Triage Note (Signed)
Pt sent from office for [facility] workup. Pt states she has had some blurry vision. Denies any HA or epigastric pain. No swelling noted. Lungs clear. +2 reflexes. No clonus. Denies any bleeding or LOF. Reports positive fetal movement. BPs cycling q 15 mins. Will continue to monitor.

## 2019-05-18 NOTE — Final Progress Note (Signed)
Physician Final Progress Note  Patient ID: Kelly Huber MRN: [id number] DOB/AGE: 1976-05-05 43 y.o.  Admit date: 05/18/2019 Admitting provider: Nadara Mustard, MD Discharge date: 05/18/2019  Admission Diagnoses: Hypertension and pregnancy [redacted] weeks EGA  Discharge Diagnoses:  Active Problems:   Hypertension affecting pregnancy   Consults: None  Significant Findings/ Diagnostic Studies:  Obstetrics Admission History & Physical   No chief complaint on file.   HPI:  43 y.o. [id number] @ [redacted]w[redacted]d (07/12/2019, by Ultrasound). Admitted on 05/18/2019:   Patient Active Problem List   Diagnosis Date Noted  . Hypertension affecting pregnancy 05/18/2019  . Fall (on) (from) other stairs and steps, initial encounter 05/05/2019  . Anemia during pregnancy in third trimester 04/20/2019  . Grand multiparity with antenatal problem, antepartum 02/12/2019  . Hypertension affecting pregnancy, antepartum 02/12/2019  . History of postpartum depression, currently pregnant 02/12/2019  . Supervision of high risk pregnancy, antepartum 11/25/2018  . Hx of preeclampsia, prior pregnancy, currently pregnant 11/25/2018  . Antepartum multigravida of advanced maternal age 15/12/2018  . Marijuana abuse 12/28/2016  . HTN (hypertension) 12/28/2016  . Lumbar radiculopathy 06/12/2016     Presents for high blood pressure noted in office today. Pt has cHTN, on Labetalol.  Pt denies ha, blurry vision, CP, SOB, epigastric pain, and worsening edema.   Prenatal care at: at [facility]. Pregnancy complicated by chronic HTN.  ROS: A review of systems was performed and negative, except as stated in the above HPI. PMHx:  Past Medical History:  Diagnosis Date  . Advanced maternal age in multigravida   . Alcohol intoxication with delirium (HCC) 12/28/2016  . Chronic hypertension   . Gestational diabetes   . Gestational hypertension 02/29/2016  . History of postpartum depression   . Marijuana use    PSHx:  Past  Surgical History:  Procedure Laterality Date  . cataract surgery    . dilation and currette     Medications:  Medications Prior to Admission  Medication Sig Dispense Refill Last Dose  . aspirin 81 MG chewable tablet Chew 81 mg by mouth daily.   05/17/2019 at Unknown time  . cetirizine (ZYRTEC) 10 MG tablet Take 10 mg by mouth daily.   Past Month at Unknown time  . DUREZOL 0.05 % EMUL Apply 1 drop to eye 2 (two) times daily.   05/18/2019 at Unknown time  . ferrous sulfate (FERROUSUL) 325 (65 FE) MG tablet Take 1 tablet (325 mg total) by mouth daily with breakfast. 60 tablet 1 05/18/2019 at Unknown time  . fluticasone (FLONASE) 50 MCG/ACT nasal spray Place into both nostrils daily.   Past Month at Unknown time  . hydrocortisone (ANUSOL-HC) 2.5 % rectal cream Place 1 application rectally 2 (two) times daily. 30 g 0 05/18/2019 at Unknown time  . LABETALOL HCL PO Take 100 mg by mouth daily.    05/18/2019 at Unknown time  . mupirocin ointment (BACTROBAN) 2 % APPLY TO AFFECTED AREA TWICE A DAY   05/18/2019 at Unknown time  . nicotine (NICODERM CQ - DOSED IN MG/24 HOURS) 14 mg/24hr patch Place 14 mg onto the skin daily.   Past Month at Unknown time  . omeprazole (PRILOSEC) 20 MG capsule Take 1 capsule (20 mg total) by mouth daily. 30 capsule 11 05/18/2019 at Unknown time  . prenatal vitamin w/FE, FA (PRENATAL 1 + 1) 27-1 MG TABS tablet Prenatal Plus (calcium carbonate) 27 mg iron-1 mg tablet  TAKE 1 TABLET BY MOUTH DAILY WHILE TRYING TO CONCEIVE, PREGNANT & BREASFEEDING  05/18/2019 at Unknown time  . doxylamine, Sleep, (UNISOM) 25 MG tablet Take 25 mg by mouth at bedtime as needed.   Not Taking at Unknown time  . triamcinolone cream (KENALOG) 0.5 % Apply 1 application topically 3 (three) times daily as needed.   Not Taking at Unknown time   Allergies: is allergic to codeine and penicillins. OBHx:  OB History  Gravida Para Term Preterm AB Living  9 6 5 1 2 6   SAB TAB Ectopic Multiple Live Births   2     0 6    # Outcome Date GA Lbr Len/2nd Weight Sex Delivery Anes PTL Lv  9 Current           8 Preterm 02/29/16 [id number]  2440 g M Vag-Spont EPI  LIV  7 Term 05/26/11 4664w0d  3232 g M Vag-Spont   LIV  6 Term 01/26/04   2722 g M Vag-Spont   LIV  5 Term 07/04/98   2722 g M Vag-Spont   LIV  4 Term 07/15/96   2722 g M Vag-Spont   LIV  3 Term 04/15/95   2722 g F Vag-Spont   LIV  2 SAB           1 SAB            ZOX:[id number]:Negative/unremarkable except as detailed in HPI.Marland Kitchen.  No family history of birth defects. Soc Hx: Alcohol: none and Recreational drug use: none  Objective:   Vitals:   05/18/19 1731 05/18/19 1746  BP: 131/75 (!) 142/64  Pulse: 88 89  Resp:    Temp:    SpO2:     Constitutional: Well nourished, well developed female in no acute distress.  HEENT: normal Skin: Warm and dry.  Cardiovascular:Regular rate and rhythm.   Extremity: trace to 1+ bilateral pedal edema Respiratory: Clear to auscultation bilateral. Normal respiratory effort Abdomen: gravid, ND, FHT present, mild tenderness on exam Back: no CVAT Neuro: DTRs 2+, Cranial nerves grossly intact Psych: Alert and Oriented x3. No memory deficits. Normal mood and affect.  MS: normal gait, normal bilateral lower extremity ROM/strength/stability.  Results for orders placed or performed during the hospital encounter of 05/18/19  CBC  Result Value Ref Range   WBC 15.5 (H) 4.0 - 10.5 K/uL   RBC 3.31 (L) 3.87 - 5.11 MIL/uL   Hemoglobin 9.9 (L) 12.0 - 15.0 g/dL   HCT 62.128.9 (L) 30.836.0 - 65.746.0 %   MCV 87.3 80.0 - 100.0 fL   MCH 29.9 26.0 - 34.0 pg   MCHC 34.3 30.0 - 36.0 g/dL   RDW 84.614.1 96.211.5 - 95.215.5 %   Platelets 348 150 - 400 K/uL   nRBC 0.0 0.0 - 0.2 %  Comprehensive metabolic panel  Result Value Ref Range   Sodium 133 (L) 135 - 145 mmol/L   Potassium 3.5 3.5 - 5.1 mmol/L   Chloride 106 98 - 111 mmol/L   CO2 18 (L) 22 - 32 mmol/L   Glucose, Bld 96 70 - 99 mg/dL   BUN 11 6 - 20 mg/dL   Creatinine, Ser 8.410.64 0.44 - 1.00  mg/dL   Calcium 8.9 8.9 - 32.410.3 mg/dL   Total Protein 6.9 6.5 - 8.1 g/dL   Albumin 3.1 (L) 3.5 - 5.0 g/dL   AST 17 15 - 41 U/L   ALT 11 0 - 44 U/L   Alkaline Phosphatase 92 38 - 126 U/L   Total Bilirubin 0.5 0.3 - 1.2 mg/dL   GFR calc non  Af Amer >60 >60 mL/min   GFR calc Af Amer >60 >60 mL/min   Anion gap 9 5 - 15  Protein / creatinine ratio, urine  Result Value Ref Range   Creatinine, Urine 115 mg/dL   Total Protein, Urine 20 mg/dL   Protein Creatinine Ratio 0.17 (H) 0.00 - 0.15 mg/mg[Cre]    Assessment & Plan:   43 y.o. [id number] @ [redacted]w[redacted]d, Admitted on 05/18/2019:cHTN at 32 weeks, no need for delivery at this time; labs reassuring; no symptoms of severe criteria  She will cont Labetalol and twice weekly appts, NST twice weekly and [place] weekly    Procedures: A NST procedure was performed with FHR monitoring and a normal baseline established, appropriate time of 20-40 minutes of evaluation, and accels >2 seen w 15x15 characteristics.  Results show a REACTIVE NST.   Discharge Condition: good  Disposition: Discharge disposition: 01-Home or Self Care       Diet: Regular diet  Discharge Activity: Activity as tolerated  Discharge Instructions    Call MD for:   Complete by: As directed    Worsening contractions or pain; leakage of fluid; bleeding.   Diet general   Complete by: As directed    Increase activity slowly   Complete by: As directed      Allergies as of 05/18/2019      Reactions   Codeine Hives, Shortness Of Breath   Penicillins Hives, Shortness Of Breath      Medication List    TAKE these medications   aspirin 81 MG chewable tablet Chew 81 mg by mouth daily.   cetirizine 10 MG tablet Commonly known as: ZYRTEC Take 10 mg by mouth daily.   doxylamine (Sleep) 25 MG tablet Commonly known as: UNISOM Take 25 mg by mouth at bedtime as needed.   Durezol 0.05 % Emul Generic drug: Difluprednate Apply 1 drop to eye 2 (two) times daily.   ferrous sulfate  325 (65 FE) MG tablet Commonly known as: FerrouSul Take 1 tablet (325 mg total) by mouth daily with breakfast.   fluticasone 50 MCG/ACT nasal spray Commonly known as: FLONASE Place into both nostrils daily.   hydrocortisone 2.5 % rectal cream Commonly known as: Anusol-HC Place 1 application rectally 2 (two) times daily.   LABETALOL HCL PO Take 100 mg by mouth daily.   mupirocin ointment 2 % Commonly known as: BACTROBAN APPLY TO AFFECTED AREA TWICE A DAY   nicotine 14 mg/24hr patch Commonly known as: NICODERM CQ - dosed in mg/24 hours Place 14 mg onto the skin daily.   omeprazole 20 MG capsule Commonly known as: PRILOSEC Take 1 capsule (20 mg total) by mouth daily.   prenatal vitamin w/FE, FA 27-1 MG Tabs tablet Prenatal Plus (calcium carbonate) 27 mg iron-1 mg tablet  TAKE 1 TABLET BY MOUTH DAILY WHILE TRYING TO CONCEIVE, PREGNANT & BREASFEEDING   triamcinolone cream 0.5 % Commonly known as: KENALOG Apply 1 application topically 3 (three) times daily as needed.      Follow-up Information    [facility]. Go in 3 day(s).   Specialty: Obstetrics and Gynecology Why: NST appt Contact information: [place] [phone] [address] [place] [phone]          Total time spent taking care of this patient: 15 minutes  Signed: Hoyt Koch 05/18/2019, 6:36 PM

## 2019-05-18 NOTE — Discharge Summary (Signed)
seeFPN  

## 2019-05-18 NOTE — Progress Notes (Signed)
RN reviewed discharge instructions with patient. Gave patient opportunity for questions. All questions answered at this time. Pt verbalized understanding. Pt discharged home. 

## 2019-05-18 NOTE — Progress Notes (Signed)
Routine Prenatal Care Visit  Subjective  Kelly Huber is a 43 y.o. [id number] at [redacted]w[redacted]d being seen today for ongoing prenatal care.  She is currently monitored for the following issues for this high-risk pregnancy and has Marijuana abuse; HTN (hypertension); Supervision of high risk pregnancy, antepartum; Hx of preeclampsia, prior pregnancy, currently pregnant; Antepartum multigravida of advanced maternal age; Lumbar radiculopathy; Grand multiparity with antenatal problem, antepartum; Hypertension affecting pregnancy, antepartum; History of postpartum depression, currently pregnant; Anemia during pregnancy in third trimester; and Fall (on) (from) other stairs and steps, initial encounter on their problem list.  ----------------------------------------------------------------------------------- Patient reports headache.  Also reports scotomata.  Legs feel tight.  Home BP readings have been consistently 150/90 range Contractions: Not present. Vag. Bleeding: None.  Movement: Present. Denies leaking of fluid.  ----------------------------------------------------------------------------------- The following portions of the patient's history were reviewed and updated as appropriate: allergies, current medications, past family history, past medical history, past social history, past surgical history and problem list. Problem list updated.   Objective  Blood pressure (!) 150/98, weight 186 lb (84.4 kg), last menstrual period 09/11/2018, unknown if currently breastfeeding. Pregravid weight 158 lb (71.7 kg) Total Weight Gain 28 lb (12.7 kg) Urinalysis:      Fetal Status: Fetal Heart Rate (bpm): 145   Movement: Present  Presentation: Vertex  General:  Alert, oriented and cooperative. Patient is in no acute distress.  Skin: Skin is warm and dry. No rash noted.   Cardiovascular: Normal heart rate noted  Respiratory: Normal respiratory effort, no problems with respiration noted  Abdomen: Soft, gravid,  appropriate for gestational age. Pain/Pressure: Absent     Pelvic:  Cervical exam deferred        Extremities: Normal range of motion.     ental Status: Normal mood and affect. Normal behavior. Normal judgment and thought content.   DG Ankle Complete Left  Result Date: 05/05/2019 CLINICAL DATA:  Fall.  Left foot and ankle pain EXAM: LEFT ANKLE COMPLETE - 3+ VIEW COMPARISON:  None. FINDINGS: There is no evidence of fracture, dislocation, or joint effusion. There is no evidence of arthropathy or other focal bone abnormality. Soft tissues are unremarkable. IMPRESSION: Negative. Electronically Signed   By: Charlett Nose M.D.   On: 05/05/2019 20:04   US OB Limited (AFI,Presentation,HR) [facility]  Result Date: 05/18/2019 Patient Name: Kelly Huber DOB: 1975-11-07 MRN: [id number] ULTRASOUND REPORT Location: [facility] Date of Service: 05/18/2019 Indications:AFI Findings: Kelly Huber intrauterine pregnancy is visualized with FHR at 145 BPM. Fetal presentation is Cephalic. Placenta: anterior. Grade: 1 AFI: 14.1 cm Impression: 1. [redacted]w[redacted]d Viable Singleton Intrauterine pregnancy dated by previously established criteria. 2. AFI is 14.1 cm. Recommendations: 1.Clinical correlation with the patient's History and Physical Exam. Deanna Artis, RT There is a singleton gestation with normal amniotic fluid volume. The visualized fetal anatomy appears within normal limits within the resolution of ultrasound as described above.  It must be noted that a normal ultrasound is unable to rule out fetal aneuploidy.  Vena Austria, MD, [facility], [facility] 05/18/2019, 2:22 PM   US OB Follow Up  Result Date: 05/17/2019 Patient Name: Kelly Huber DOB: [birth year] MRN: [id number] ULTRASOUND REPORT Location: [facility] Date of Service: 05/12/2019 Indications:growth/afi Findings: Kelly Huber intrauterine pregnancy is visualized with FHR at 140 BPM. Biometrics give an (U/S) Gestational age of [redacted]w[redacted]d  and an (U/S) EDD of 07/14/2019; this correlates with the clinically established Estimated Date of Delivery: 07/12/19. Fetal presentation is Oblique. Placenta: anterior. Grade: 1 AFI: 22.4  cm Growth percentile is 50.3%.  AC percentile is 84.5%. EFW: 1764 g ( 14 oz )  Impression: 1. [redacted]w[redacted]d Viable Singleton Intrauterine pregnancy previously established criteria. 2. Growth is 50.3 %ile.  AFI is 22.4 cm. Recommendations: 1.Clinical correlation with the patient's History and Physical Exam. Gweneth Dimitri, RT Review of ULTRASOUND.    I have personally reviewed images and report of recent ultrasound done at [facility].    Plan of management to be discussed with patient. Barnett Applebaum, MD, [facility], [facility] 05/17/2019  5:03 PM  DG Foot Complete Left  Result Date: 05/05/2019 CLINICAL DATA:  Fall down steps.  Left foot and ankle pain EXAM: LEFT FOOT - COMPLETE 3+ VIEW COMPARISON:  None. FINDINGS: There is no evidence of fracture or dislocation. There is no evidence of arthropathy or other focal bone abnormality. Soft tissues are unremarkable. IMPRESSION: Negative. Electronically Signed   By: Rolm Baptise M.D.   On: 05/05/2019 20:04     Assessment   43 y.o. [id number] at [redacted]w[redacted]d by  07/12/2019, by Ultrasound presenting for routine prenatal visit  Plan   NINTH Problems (from 11/25/18 to present)    Problem Noted Resolved   [place] multiparity with antenatal problem, antepartum 02/12/2019 by Malachy Mood, MD No   Overview Signed 02/12/2019  5:14 PM by Malachy Mood, MD    Increased risk of uterine atony and postpartum hemorrhage      Hypertension affecting pregnancy, antepartum 02/12/2019 by Malachy Mood, MD No   Overview Addendum 05/18/2019  2:28 PM by Malachy Mood, MD    [X]  Aspirin 81 mg daily after 12 weeks; discontinue after 36 weeks [X]  baseline labs with CBC, CMP, urine protein/creatinine ratio [X]  no BP meds unless BPs become elevated - on labetalol 100mg   bid [ ]  ultrasound for growth at 27 week 962g 3lbs 2oz c/w 42/9%ile, 31 week 1765g 2lbs 14oz c/w 50.3%ile 36 weeks  Current antihypertensives:  Labetalol   Baseline and surveillance labs (pulled in from [facility], refresh links as needed)  Lab Results  Component Value Date   PLT 389 11/25/2018   CREATININE 0.88 12/20/2018   AST 54 (H) 12/17/2017   ALT 53 (H) 12/17/2017   PROTCRRATIO 0.29 (H) 02/28/2016   LABPROT 11.8 12/28/2016   PROTEIN24HR 110 12/20/2018    Antenatal Testing CHTN - O10.919  Group I  BP < 140/90, no preeclampsia, AGA,  nml AFV, +/- meds    Group II BP > 140/90, on meds, no preeclampsia, AGA, nml AFV  20-28-34-38  20-24-28-32-35-38  32//2 x wk  28//BPP wkly then 32//2 x wk  40 no meds; 39 meds  PRN or 37  Pre-eclampsia  GHTN - O13.9/Preeclampsia without severe features  - O14.00   Preeclampsia with severe features - O14.10  Q 3-4wks  Q 2 wks  28//BPP wkly then 32//2 x wk  Inpatient  37  PRN or 34         History of postpartum depression, currently pregnant 02/12/2019 by Malachy Mood, MD No   Supervision of high risk pregnancy, antepartum 11/25/2018 by Rexene Agent, CNM No   Overview Addendum 05/18/2019  2:29 PM by Malachy Mood, MD    Clinic [facility] Prenatal Labs  Dating 8wk [place] Blood type: O/Positive/-- (07/08 1051)   Genetic Screen  NIPS: diploid XY Antibody:Negative (07/08 1051)  Anatomic [place] Normal Rubella: 2.53 (07/08 1051) Varicella:  Immune  GTT Third trimester: 121 RPR: Non Reactive (07/08 1051)   Rhogam Not needed HBsAg: Negative (  07/08 1051)   TDaP vaccine                       Flu Shot: HIV: Non Reactive (07/08 1051)   Baby Food                                GBS:   Contraception  Pap: NIL 2020  CBB   Pelvis tested to 7lbs 2oz  CS/VBAC N/A Hgb AA  Support Person Ahmad             Hx of preeclampsia, prior pregnancy, currently pregnant 11/25/2018 by Oswaldo ConroySchmid, Jacelyn Y, CNM No      Gestational age appropriate obstetric  precautions including but not limited to vaginal bleeding, contractions, leaking of fluid and fetal movement were reviewed in detail with the patient.    - To L&D for serial BP, P/C ratio, CMP and CBC - adjusted labetalol if needed, consider BMZ - Ultrasound with normal AFI - reactive NST - Twice weekly APT  Given office closed on 05/21/2019 will perform NST in NST  Return in about 1 week (around 05/25/2019) for 1/5 ROB/NST/AFI and 1/8 NST/ROB.  Vena AustriaAndreas Tyrann Donaho, MD, Evern CoreFACOG [facility], [facility] 05/18/2019, 2:52 PM

## 2019-05-18 NOTE — Progress Notes (Signed)
ROB NST/AFI 

## 2019-05-19 LAB — PROTEIN / CREATININE RATIO, URINE
Creatinine, Urine: 213.8 mg/dL
Protein, Ur: 38.2 mg/dL
Protein/Creat Ratio: 179 mg/g creat (ref 0–200)

## 2019-05-21 ENCOUNTER — Encounter: Payer: Self-pay | Admitting: Obstetrics & Gynecology

## 2019-05-21 ENCOUNTER — Observation Stay
Admission: EM | Admit: 2019-05-21 | Discharge: 2019-05-21 | Disposition: A | Discharge status: Home / Self Care | Payer: Medicaid Other | Attending: Obstetrics & Gynecology | Admitting: Obstetrics & Gynecology

## 2019-05-21 DIAGNOSIS — O133 Gestational [pregnancy-induced] hypertension without significant proteinuria, third trimester: Principal | ICD-10-CM | POA: Insufficient documentation

## 2019-05-21 DIAGNOSIS — O169 Unspecified maternal hypertension, unspecified trimester: Secondary | ICD-10-CM | POA: Diagnosis present

## 2019-05-21 DIAGNOSIS — Z885 Allergy status to narcotic agent status: Secondary | ICD-10-CM | POA: Diagnosis not present

## 2019-05-21 DIAGNOSIS — Z880 Allergy status to penicillin: Secondary | ICD-10-CM | POA: Insufficient documentation

## 2019-05-21 DIAGNOSIS — Z3A32 32 weeks gestation of pregnancy: Secondary | ICD-10-CM | POA: Insufficient documentation

## 2019-05-21 DIAGNOSIS — O10913 Unspecified pre-existing hypertension complicating pregnancy, third trimester: Secondary | ICD-10-CM | POA: Diagnosis not present

## 2019-05-21 MED ORDER — ACETAMINOPHEN 325 MG PO TABS: 650.0000 mg | ORAL_TABLET | ORAL | Status: DC | PRN

## 2019-05-21 MED ORDER — ONDANSETRON HCL 4 MG/2ML IJ SOLN: 4.0000 mg | Freq: Four times a day (QID) | INTRAMUSCULAR | Status: DC | PRN

## 2019-05-21 NOTE — Final Progress Note (Signed)
Physician Final Progress Note  Patient ID: Kelly Huber MRN: [id number] DOB/AGE: 19-Apr-1976 44 y.o.  Admit date: 05/21/2019 Admitting provider: Nadara Mustard, MD Discharge date: 05/21/2019  Admission Diagnoses: HTN Preg, 32 weeks  Discharge Diagnoses:  Active Problems:   Hypertension affecting pregnancy    Consults: None  Significant Findings/ Diagnostic Studies: Patient presented for evaluation of gestational HTN for NST and BP check both of which are normal and reassuring.  I reviewed her vital signs and fetal tracing, both of which were reassuring.  Patient was discharge as she was not laboring and showed no signs of fetal concerns related to HTN..  Procedures: A NST procedure was performed with FHR monitoring and a normal baseline established, appropriate time of 20-40 minutes of evaluation, and accels >2 seen w 15x15 characteristics.  Results show a REACTIVE NST.   Discharge Condition: good  Disposition: Discharge disposition: 01-Home or Self Care       Diet: Regular diet  Discharge Activity: Activity as tolerated  Discharge Instructions    Call MD for:   Complete by: As directed    Worsening contractions or pain; leakage of fluid; bleeding.   Diet general   Complete by: As directed    Increase activity slowly   Complete by: As directed      Allergies as of 05/21/2019      Reactions   Codeine Hives, Shortness Of Breath   Penicillins Hives, Shortness Of Breath      Medication List    TAKE these medications   aspirin 81 MG chewable tablet Chew 81 mg by mouth daily.   cetirizine 10 MG tablet Commonly known as: ZYRTEC Take 10 mg by mouth daily.   doxylamine (Sleep) 25 MG tablet Commonly known as: UNISOM Take 25 mg by mouth at bedtime as needed.   Durezol 0.05 % Emul Generic drug: Difluprednate Apply 1 drop to eye 2 (two) times daily.   ferrous sulfate 325 (65 FE) MG tablet Commonly known as: FerrouSul Take 1 tablet (325 mg total) by mouth daily  with breakfast.   fluticasone 50 MCG/ACT nasal spray Commonly known as: FLONASE Place into both nostrils daily.   hydrocortisone 2.5 % rectal cream Commonly known as: Anusol-HC Place 1 application rectally 2 (two) times daily.   LABETALOL HCL PO Take 100 mg by mouth daily.   mupirocin ointment 2 % Commonly known as: BACTROBAN APPLY TO AFFECTED AREA TWICE A DAY   nicotine 14 mg/24hr patch Commonly known as: NICODERM CQ - dosed in mg/24 hours Place 14 mg onto the skin daily.   omeprazole 20 MG capsule Commonly known as: PRILOSEC Take 1 capsule (20 mg total) by mouth daily.   prenatal vitamin w/FE, FA 27-1 MG Tabs tablet Prenatal Plus (calcium carbonate) 27 mg iron-1 mg tablet  TAKE 1 TABLET BY MOUTH DAILY WHILE TRYING TO CONCEIVE, PREGNANT & BREASFEEDING   triamcinolone cream 0.5 % Commonly known as: KENALOG Apply 1 application topically 3 (three) times daily as needed.        Total time spent taking care of this patient: TRIAGE  Signed: Letitia Libra 05/21/2019, 3:00 PM

## 2019-05-21 NOTE — OB Triage Note (Signed)
Provider notified of reactive NST. Discharge orders given. Pt instructed of when to return or call provider. Pt to follow up in the office at her next scheduled appointment. Pt verbalized understanding of discharge instructions and has no further questions at this time.

## 2019-05-21 NOTE — Discharge Summary (Signed)
  See FPN 

## 2019-05-21 NOTE — L&D Delivery Note (Signed)
Delivery Note At 8:36 PM a female child was delivered via Vaginal, Spontaneous precipitously delivered prior to provider able to attend.  APGAR:8, 9; weightpending.   Placenta status: Spontaneous, Intact.  Cord: 3 vessels with the following complications:superimposed preeclampsia with severe features.  Cord pH: N/A  Anesthesia: Epidural Episiotomy: None Lacerations: None Suture Repair: none Est. Blood Loss (mL):   Mom to postpartum.  Baby to Couplet care / Skin to Skin.  Vena Austria 06/12/2019, 9:06 PM

## 2019-05-21 NOTE — OB Triage Note (Signed)
Pt is a 43yo G9P6 at [redacted]w[redacted]d that presents for her scheduled NST. Pt denies headache, visual changes, epigastric pain. Pt states some decreased movement today but denies VB, LOF and states occasional ctx but nothing consistent. Monitors applied and assessing with initial fht 145. Initial BP 144/91 and cycling q19min. Pt states oral BP medicine was taken 11am today.

## 2019-05-24 ENCOUNTER — Telehealth: Payer: Self-pay | Admitting: Obstetrics and Gynecology

## 2019-05-24 ENCOUNTER — Other Ambulatory Visit: Payer: Self-pay

## 2019-05-24 ENCOUNTER — Encounter: Payer: Self-pay | Admitting: Obstetrics and Gynecology

## 2019-05-24 ENCOUNTER — Observation Stay
Admission: EM | Admit: 2019-05-24 | Discharge: 2019-05-25 | Disposition: A | Discharge status: Home / Self Care | Payer: Medicaid Other | Attending: Certified Nurse Midwife | Admitting: Certified Nurse Midwife

## 2019-05-24 DIAGNOSIS — O09523 Supervision of elderly multigravida, third trimester: Secondary | ICD-10-CM | POA: Diagnosis present

## 2019-05-24 DIAGNOSIS — O163 Unspecified maternal hypertension, third trimester: Secondary | ICD-10-CM | POA: Insufficient documentation

## 2019-05-24 DIAGNOSIS — Z79899 Other long term (current) drug therapy: Secondary | ICD-10-CM | POA: Insufficient documentation

## 2019-05-24 DIAGNOSIS — O4703 False labor before 37 completed weeks of gestation, third trimester: Secondary | ICD-10-CM

## 2019-05-24 DIAGNOSIS — O10913 Unspecified pre-existing hypertension complicating pregnancy, third trimester: Secondary | ICD-10-CM | POA: Diagnosis not present

## 2019-05-24 DIAGNOSIS — R109 Unspecified abdominal pain: Secondary | ICD-10-CM | POA: Insufficient documentation

## 2019-05-24 DIAGNOSIS — Z7982 Long term (current) use of aspirin: Secondary | ICD-10-CM | POA: Diagnosis not present

## 2019-05-24 DIAGNOSIS — R519 Headache, unspecified: Secondary | ICD-10-CM | POA: Diagnosis not present

## 2019-05-24 DIAGNOSIS — Z87891 Personal history of nicotine dependence: Secondary | ICD-10-CM | POA: Insufficient documentation

## 2019-05-24 DIAGNOSIS — Z3A33 33 weeks gestation of pregnancy: Secondary | ICD-10-CM | POA: Insufficient documentation

## 2019-05-24 LAB — COMPREHENSIVE METABOLIC PANEL
ALT: 13 U/L (ref 0–44)
AST: 19 U/L (ref 15–41)
Albumin: 3.3 g/dL — ABNORMAL LOW (ref 3.5–5.0)
Alkaline Phosphatase: 105 U/L (ref 38–126)
Anion gap: 11 (ref 5–15)
BUN: 8 mg/dL (ref 6–20)
CO2: 18 mmol/L — ABNORMAL LOW (ref 22–32)
Calcium: 9.1 mg/dL (ref 8.9–10.3)
Chloride: 107 mmol/L (ref 98–111)
Creatinine, Ser: 0.68 mg/dL (ref 0.44–1.00)
GFR calc Af Amer: >60 mL/min (ref >60)
GFR calc non Af Amer: >60 mL/min (ref >60)
Glucose, Bld: 102 mg/dL — ABNORMAL HIGH (ref 70–99)
Potassium: 3.7 mmol/L (ref 3.5–5.1)
Sodium: 136 mmol/L (ref 135–145)
Total Bilirubin: 0.4 mg/dL (ref 0.3–1.2)
Total Protein: 7.4 g/dL (ref 6.5–8.1)

## 2019-05-24 LAB — OB RESULTS CONSOLE GC/CHLAMYDIA
Chlamydia: NEGATIVE
Gonorrhea: NEGATIVE

## 2019-05-24 LAB — CBC
HCT: 32.2 % — ABNORMAL LOW (ref 36.0–46.0)
Hemoglobin: 10.8 g/dL — ABNORMAL LOW (ref 12.0–15.0)
MCH: 29.8 pg (ref 26.0–34.0)
MCHC: 33.5 g/dL (ref 30.0–36.0)
MCV: 88.7 fL (ref 80.0–100.0)
Platelets: 349 10*3/uL (ref 150–400)
RBC: 3.63 MIL/uL — ABNORMAL LOW (ref 3.87–5.11)
RDW: 14.4 % (ref 11.5–15.5)
WBC: 16.8 10*3/uL — ABNORMAL HIGH (ref 4.0–10.5)
nRBC: 0 % (ref 0.0–0.2)

## 2019-05-24 LAB — URINALYSIS, COMPLETE (UACMP) WITH MICROSCOPIC
Bacteria, UA: NONE SEEN
Bilirubin Urine: NEGATIVE
Glucose, UA: NEGATIVE mg/dL
Hgb urine dipstick: NEGATIVE
Ketones, ur: NEGATIVE mg/dL
Leukocytes,Ua: NEGATIVE
Nitrite: NEGATIVE
Protein, ur: NEGATIVE mg/dL
Specific Gravity, Urine: 1.014 (ref 1.005–1.030)
pH: 6 (ref 5.0–8.0)

## 2019-05-24 LAB — PROTEIN / CREATININE RATIO, URINE
Creatinine, Urine: 123 mg/dL
Protein Creatinine Ratio: 0.15 mg/mg{Cre} (ref 0.00–0.15)
Total Protein, Urine: 18 mg/dL

## 2019-05-24 LAB — TYPE AND SCREEN
ABO/RH(D): O POS
Antibody Screen: NEGATIVE

## 2019-05-24 LAB — GROUP B STREP BY PCR: Group B strep by PCR: NEGATIVE

## 2019-05-24 LAB — OB RESULTS CONSOLE GBS: GBS: NEGATIVE

## 2019-05-24 LAB — FETAL FIBRONECTIN: Fetal Fibronectin: NEGATIVE

## 2019-05-24 MED ORDER — BUTORPHANOL TARTRATE 1 MG/ML IJ SOLN: 1.0000 mg | Freq: Once | INTRAMUSCULAR | Status: AC

## 2019-05-24 MED ORDER — LABETALOL HCL 100 MG PO TABS: 100.0000 mg | ORAL_TABLET | Freq: Two times a day (BID) | ORAL | Status: DC

## 2019-05-24 MED ORDER — TERBUTALINE SULFATE 1 MG/ML IJ SOLN: 0.2500 mg | Freq: Once | INTRAMUSCULAR | Status: AC

## 2019-05-24 MED ORDER — OXYTOCIN BOLUS FROM INFUSION: 500.0000 mL | Freq: Once | INTRAVENOUS | Status: DC

## 2019-05-24 MED ORDER — OXYTOCIN 40 UNITS IN NORMAL SALINE INFUSION - SIMPLE MED: 2.5000 [IU]/h | INTRAVENOUS | Status: DC

## 2019-05-24 MED ORDER — LACTATED RINGERS IV SOLN: 500.0000 mL | INTRAVENOUS | Status: DC | PRN

## 2019-05-24 MED ORDER — LABETALOL HCL 100 MG PO TABS
100.0000 mg | ORAL_TABLET | Freq: Once | ORAL | Status: AC
  Administered 2019-05-24: 100 mg via ORAL
  Filled 2019-05-24: qty 1

## 2019-05-24 MED ORDER — LACTATED RINGERS IV SOLN: INTRAVENOUS | Status: DC

## 2019-05-24 MED ORDER — TERBUTALINE SULFATE 1 MG/ML IJ SOLN
INTRAMUSCULAR | Status: AC
  Administered 2019-05-24: 0.25 mg via SUBCUTANEOUS
  Filled 2019-05-24: qty 1

## 2019-05-24 MED ORDER — BUTORPHANOL TARTRATE 1 MG/ML IJ SOLN
INTRAMUSCULAR | Status: AC
  Administered 2019-05-24: 1 mg via INTRAVENOUS
  Filled 2019-05-24: qty 1

## 2019-05-24 MED ORDER — TERBUTALINE SULFATE 1 MG/ML IJ SOLN: 0.2500 mg | Freq: Once | INTRAMUSCULAR | Status: DC

## 2019-05-24 NOTE — H&P (Signed)
OB History & Physical   History of Present Illness:  Chief Complaint:   HPI:  Kelly Huber is a 44 y.o. [phone] female with EDC=07/12/2019 at [redacted]w[redacted]d dated by an 8wk3d ultrasound.  Her pregnancy has been complicated by AMA, grand multiparity, chronic hypertension, a history of superimposed preeclampsia with her last pregnancy in 2017, marijuana use, anemia, postpartum depression,and more recently a fall down stairs (12/16).  She is currently taking labetalol 100 mgm BID. Antepartum testing has been reassuring and the last growth scan 12/23 had EFW at the 50th%(3#14oz).  She presents to L&D for evaluation of abdominal pains and pelvic pressure as well as elevated blood pressures.  The abdominal pain and tightening began this morning at 0800. She noticed passing mucoid discharge when she wipes. The pains became more frequent when at work and her blood pressures started to get higher the more uncomfortable she became. Denies urinary complaints, vaginal bleeding, chest pain, epigastric pain, SOB. Has a frontal headache that did not resolve with Tylenol ay 1745 and she now rates at a 2/10. She has some "dark floaters" in her vision. Baby moving well.      Prenatal care site: Prenatal care at [facility] has been remarkable for  [facility] Prenatal Labs  Dating 8wk [place] Blood type: O/Positive/-- (07/08 1051)   Genetic Screen  NIPS: diploid XY Antibody:Negative (07/08 1051)  Anatomic [place] Normal Rubella: 2.53 (07/08 1051) Varicella:  Immune  GTT Third trimester: 121 RPR: Non Reactive (07/08 1051)   Rhogam Not needed HBsAg: Negative (07/08 1051)   TDaP vaccine                       Flu Shot: HIV: Non Reactive (07/08 1051)   Baby Food        Breast and bottle                        GBS:   Contraception undecided Pap: NIL 2020  CBB   Pelvis tested to 7lbs 2oz  CS/VBAC N/A Hgb AA  Support Person [place]            Maternal Medical History:   Past Medical History:  Diagnosis Date  .  Advanced maternal age in multigravida   . Alcohol intoxication with delirium ([place]) 12/28/2016  . Chronic hypertension   . Gestational diabetes   . Gestational hypertension 02/29/2016  . History of postpartum depression   . Marijuana use     Past Surgical History:  Procedure Laterality Date  . cataract surgery    . dilation and currette      Allergies  Allergen Reactions  . Codeine Hives and Shortness Of Breath  . Penicillins Hives and Shortness Of Breath   Current Medications:  No current facility-administered medications on file prior to encounter.   Current Outpatient Medications on File Prior to Encounter  Medication Sig Dispense Refill  . aspirin 81 MG chewable tablet Chew 81 mg by mouth daily.    . cetirizine (ZYRTEC) 10 MG tablet Take 10 mg by mouth daily.    . ferrous sulfate (FERROUSUL) 325 (65 FE) MG tablet Take 1 tablet (325 mg total) by mouth daily with breakfast. 60 tablet 1  . hydrocortisone (ANUSOL-HC) 2.5 % rectal cream Place 1 application rectally 2 (two) times daily. 30 g 0  . LABETALOL HCL PO Take 100 mg by mouth daily.     . nicotine (NICODERM CQ - DOSED IN MG/24 HOURS)  14 mg/24hr patch Place 14 mg onto the skin daily.    Marland Kitchen omeprazole (PRILOSEC) 20 MG capsule Take 1 capsule (20 mg total) by mouth daily. 30 capsule 11  . prenatal vitamin w/FE, FA (PRENATAL 1 + 1) 27-1 MG TABS tablet Prenatal Plus (calcium carbonate) 27 mg iron-1 mg tablet  TAKE 1 TABLET BY MOUTH DAILY WHILE TRYING TO CONCEIVE, PREGNANT & BREASFEEDING    . fluticasone (FLONASE) 50 MCG/ACT nasal spray Place into both nostrils daily.    Marland Kitchen triamcinolone cream (KENALOG) 0.5 % Apply 1 application topically 3 (three) times daily as needed.     Social History: She  reports that she quit smoking about 3 years ago. Her smoking use included cigarettes. She has a 5.00 pack-year smoking history. She has never used smokeless tobacco. She reports current drug use. Drug: Marijuana. She reports that she does not  drink alcohol.  Family History: family history includes Bipolar disorder in her brother and father; Hypertension in her mother; Schizophrenia in her father.   Review of Systems: Negative x 10 systems reviewed except as noted in the HPI.      Physical Exam:  Vital Signs: BP (!) 158/98 (BP Location: Right Arm)   Pulse 84   Temp 98.6 F (37 C) (Oral)   Resp 18   Ht 5\' 3"  (1.6 m)   Wt 84.6 kg   LMP 09/11/2018 (Exact Date)   BMI 33.05 kg/m   .154/100, 143/99, 153/98 General: BF, intermittently wincing with painful contraction HEENT: normocephalic, atraumatic. Conjunctiva appear red Heart: regular rate & rhythm.  No murmurs/rubs/gallops Lungs: clear to auscultation bilaterally Abdomen: soft, gravid, tender with contraction;  Pelvic:   External: Normal external female genitalia  Cervix: Dilation: Fingertip / Effacement (%): 50 / Station: -2  To -3 and ballotable/ firm/mid  Extremities: non-tender, symmetric, no edema bilaterally.  DTRs: +1  Neurologic: Alert & oriented x 3.   Baseline FHR: 140 baseline with accelerations to 150s to 160, moderate variability    Bedside Ultrasound: cephalic presentation (OP)    Assessment:  Kelly Huber is a 44 y.o. 46 female at [redacted]w[redacted]d with chronic hypertension with elevated blood pressures. R/O superimposed preeclampsia Threatened preterm labor  Plan:  1. Admit to Labor & Delivery  2. CBC, T&S, CMP, RPR, Urinalysis, PC ratio, APtima, GBS, FFN 3. IV bolus then 174ml/hr 4. Terbutaline 0.25 mgm Comfort as needed for tocolysis 5. Consider magnesium sulfate if cervix changes or FFN is positive 6. Offered betamethasone-declines at this time. 7.  Stadol 1 mgm for pain 8 Labetalol 100 mgm po for PM dose. Monitor blood pressures closely and give IV labetalol for  severe range blood pressures. 32m  05/24/2019 8:09 PM

## 2019-05-24 NOTE — Telephone Encounter (Signed)
Patient is calling with the complaint of pelvic pressure and pain. Patient is schedule tomorrow am. Please advise

## 2019-05-24 NOTE — Progress Notes (Signed)
L&D Progress Note   S: Still has headache after eating and getting Stadol for pain. Still feeling contractions   O: BP 140/88   Pulse 87   Temp 98.6 F (37 C) (Oral)   Resp 18   Ht 5\' 3"  (1.6 m)   Wt 84.6 kg   LMP 09/11/2018 (Exact Date)   BMI 33.05 kg/m   Patient Vitals for the past 24 hrs:  BP Temp Temp src Pulse Resp Height Weight  05/24/19 2215 140/88 -- -- 87 -- -- --  05/24/19 2115 136/85 -- -- 76 18 -- --  05/24/19 2100 (!) 146/86 -- -- 85 -- -- --  05/24/19 2045 134/83 -- -- 75 -- -- --  05/24/19 2015 (!) 138/91 -- -- 80 -- -- --  05/24/19 1958 (!) 158/98 -- -- 84 -- -- --  05/24/19 1944 (!) 143/99 -- -- 98 18 -- --  05/24/19 1925 (!) 154/100 98.6 F (37 C) Oral 87 18 5\' 3"  (1.6 m) 84.6 kg   General: appears more comfortable at this time. FHR: 135 baseline with moderate variability Toco: contractions every 2-8 minutes apart Cervix: no change (FT/50%/-3/firm/mid)  Results for orders placed or performed during the hospital encounter of 05/24/19 (from the past 24 hour(s))  Type and screen [facility]     Status: None (Preliminary result)   Collection Time: 05/24/19  7:48 PM  Result Value Ref Range   ABO/RH(D) PENDING    Antibody Screen PENDING    Sample Expiration      05/27/2019,2359 Performed at [facility], [address] [place], [place] [address]   Urinalysis, Complete w Microscopic     Status: Abnormal   Collection Time: 05/24/19  7:58 PM  Result Value Ref Range   Color, Urine YELLOW (A) YELLOW   APPearance CLEAR (A) CLEAR   Specific Gravity, Urine 1.014 1.005 - 1.030   pH 6.0 5.0 - 8.0   Glucose, UA NEGATIVE NEGATIVE mg/dL   Hgb urine dipstick NEGATIVE NEGATIVE   Bilirubin Urine NEGATIVE NEGATIVE   Ketones, ur NEGATIVE NEGATIVE mg/dL   Protein, ur NEGATIVE NEGATIVE mg/dL   Nitrite NEGATIVE NEGATIVE   Leukocytes,Ua NEGATIVE NEGATIVE   RBC / HPF 0-5 0 - 5 RBC/hpf   WBC, UA 0-5 0 - 5 WBC/hpf   Bacteria, UA NONE SEEN  NONE SEEN   Squamous Epithelial / LPF 0-5 0 - 5   Mucus PRESENT   Comprehensive metabolic panel     Status: Abnormal   Collection Time: 05/24/19  7:58 PM  Result Value Ref Range   Sodium 136 135 - 145 mmol/L   Potassium 3.7 3.5 - 5.1 mmol/L   Chloride 107 98 - 111 mmol/L   CO2 18 (L) 22 - 32 mmol/L   Glucose, Bld 102 (H) 70 - 99 mg/dL   BUN 8 6 - 20 mg/dL   Creatinine, Ser 0.68 0.44 - 1.00 mg/dL   Calcium 9.1 8.9 - 10.3 mg/dL   Total Protein 7.4 6.5 - 8.1 g/dL   Albumin 3.3 (L) 3.5 - 5.0 g/dL   AST 19 15 - 41 U/L   ALT 13 0 - 44 U/L   Alkaline Phosphatase 105 38 - 126 U/L   Total Bilirubin 0.4 0.3 - 1.2 mg/dL   GFR calc non Af Amer >60 >60 mL/min   GFR calc Af Amer >60 >60 mL/min   Anion gap 11 5 - 15  Protein / creatinine ratio, urine     Status:  None   Collection Time: 05/24/19  7:58 PM  Result Value Ref Range   Creatinine, Urine 123 mg/dL   Total Protein, Urine 18 mg/dL   Protein Creatinine Ratio 0.15 0.00 - 0.15 mg/mg[Cre]  Fetal fibronectin     Status: None   Collection Time: 05/24/19  7:58 PM  Result Value Ref Range   Fetal Fibronectin NEGATIVE NEGATIVE  CBC     Status: Abnormal   Collection Time: 05/24/19  8:32 PM  Result Value Ref Range   WBC 16.8 (H) 4.0 - 10.5 K/uL   RBC 3.63 (L) 3.87 - 5.11 MIL/uL   Hemoglobin 10.8 (L) 12.0 - 15.0 g/dL   HCT 27.0 (L) 78.6 - 75.4 %   MCV 88.7 80.0 - 100.0 fL   MCH 29.8 26.0 - 34.0 pg   MCHC 33.5 30.0 - 36.0 g/dL   RDW 49.2 01.0 - 07.1 %   Platelets 349 150 - 400 K/uL   nRBC 0.0 0.0 - 0.2 %  Type and screen     Status: None   Collection Time: 05/24/19  9:11 PM  Result Value Ref Range   ABO/RH(D) O POS    Antibody Screen NEG    Sample Expiration      05/27/2019,2359 Performed at [facility], [address] [place], [place] [address]     A: IUP at 33 weeks with threatened preterm labor  FFN negative and no cervical change CHTN with mild range to normal blood pressures  Normal preeclampsia labs  P:  Monitor contractions and for cervical change overnight  Offer betamethasone for cervical change and start magnesium sulfate Monitor blood pressures and treat for severe range blood pressures.   Farrel Conners, CNM

## 2019-05-24 NOTE — Telephone Encounter (Signed)
Called pt to speak with her about this pain and advised her to time her contractions and try to do comforting things. Advised pt to go to L&D if she feels she is truly in labor. Pt agreed

## 2019-05-25 ENCOUNTER — Telehealth: Payer: Self-pay | Admitting: Certified Nurse Midwife

## 2019-05-25 ENCOUNTER — Encounter: Payer: Medicaid Other | Admitting: Obstetrics and Gynecology

## 2019-05-25 ENCOUNTER — Other Ambulatory Visit: Payer: Medicaid Other

## 2019-05-25 DIAGNOSIS — O09523 Supervision of elderly multigravida, third trimester: Secondary | ICD-10-CM | POA: Diagnosis not present

## 2019-05-25 LAB — CHLAMYDIA/NGC RT PCR (ARMC ONLY)
Chlamydia Tr: NOT DETECTED
N gonorrhoeae: NOT DETECTED

## 2019-05-25 LAB — RPR: RPR Ser Ql: NONREACTIVE

## 2019-05-25 MED ORDER — LABETALOL HCL 100 MG PO TABS: 100.0000 mg | ORAL_TABLET | Freq: Two times a day (BID) | ORAL | Status: DC

## 2019-05-25 NOTE — Final Progress Note (Signed)
Physician Final Progress Note  Patient ID: Kelly Huber MRN: [id number] DOB/AGE: 42-13-1977 42 y.o.  Admit date: 05/24/2019 Admitting provider: Natale Milch, MD/ Gasper Lloyd. Sharen Hones, CNM Discharge date: 05/25/2019   Admission Diagnoses: Abdominal and back pain at [redacted] weeks gestation Chronic hypertension Elevated blood pressure  Discharge Diagnoses:  Threatened preterm labor at 33 weeks Chronic hypertension Transient blood pressure elevation  Consults: None  Significant Findings/ Diagnostic Studies: HPI:  Kelly Huber is a 44 y.o. [phone] female with EDC=07/12/2019 at 103w0d dated by an 8wk3d ultrasound.  Her pregnancy has been complicated by AMA, grand multiparity, chronic hypertension, a history of superimposed preeclampsia with her last pregnancy in 2017, marijuana use, anemia, postpartum depression,and more recently a fall down stairs (12/16).  She is currently taking labetalol 100 mgm BID. Antepartum testing has been reassuring and the last growth scan 12/23 had EFW at the 50th%(3#14oz).  She presents to L&D for evaluation of abdominal pains and pelvic pressure as well as elevated blood pressures.  The abdominal pain and tightening began this morning at 0800. She noticed passing mucoid discharge when she wipes. The pains became more frequent when at work and her blood pressures started to get higher the more uncomfortable she became. Denies urinary complaints, vaginal bleeding, chest pain, epigastric pain, SOB. Has a frontal headache that did not resolve with Tylenol ay 1745 and she now rates at a 2/10. She has some "dark floaters" in her vision. Baby moving well On arrival, the patient was contracting every 2-4 minutes apart. Fetal heart tracing was reactive. Cervix was FT/50%/-2 to -3. She received an IV fluid bolus and 2 doses of terbutaline subcutaneously for tocolysis and 1 mgm of Stadol for pain. Her contractions began to space out and over the last 3 hours had few  contractions. There was no cervical change over three hours.  Urinalysis, Aptima, and GBS PCR were all negative. FFN was negative. Blood pressures on arrival were in the mild range (158/98). She received her scheduled 100 mgm labetalol orally and her blood pressures normalized as her discomfort faded. Last three blood pressures were 124/70, 119/63, 104/55. Preeclampsia labs were normal.   Patient requested discharge at 0200 05/25/2019. Since blood pressures were normal and contractions had ceased, she was discharged home. Follow up on Friday 05/28/2019 for NST/ AFI/ BP check  Lab results: Results for orders placed or performed during the hospital encounter of 05/24/19 (from the past 24 hour(s))  Type and screen [facility]     Status: None (Preliminary result)   Collection Time: 05/24/19  7:48 PM  Result Value Ref Range   ABO/RH(D) PENDING    Antibody Screen PENDING    Sample Expiration      05/27/2019,2359 Performed at [facility], [address] [place], [place] [address]   Urinalysis, Complete w Microscopic     Status: Abnormal   Collection Time: 05/24/19  7:58 PM  Result Value Ref Range   Color, Urine YELLOW (A) YELLOW   APPearance CLEAR (A) CLEAR   Specific Gravity, Urine 1.014 1.005 - 1.030   pH 6.0 5.0 - 8.0   Glucose, UA NEGATIVE NEGATIVE mg/dL   Hgb urine dipstick NEGATIVE NEGATIVE   Bilirubin Urine NEGATIVE NEGATIVE   Ketones, ur NEGATIVE NEGATIVE mg/dL   Protein, ur NEGATIVE NEGATIVE mg/dL   Nitrite NEGATIVE NEGATIVE   Leukocytes,Ua NEGATIVE NEGATIVE   RBC / HPF 0-5 0 - 5 RBC/hpf   WBC, UA 0-5 0 - 5 WBC/hpf   Bacteria,  UA NONE SEEN NONE SEEN   Squamous Epithelial / LPF 0-5 0 - 5   Mucus PRESENT   Comprehensive metabolic panel     Status: Abnormal   Collection Time: 05/24/19  7:58 PM  Result Value Ref Range   Sodium 136 135 - 145 mmol/L   Potassium 3.7 3.5 - 5.1 mmol/L   Chloride 107 98 - 111 mmol/L   CO2 18 (L) 22 - 32 mmol/L    Glucose, Bld 102 (H) 70 - 99 mg/dL   BUN 8 6 - 20 mg/dL   Creatinine, Ser 0.68 0.44 - 1.00 mg/dL   Calcium 9.1 8.9 - 10.3 mg/dL   Total Protein 7.4 6.5 - 8.1 g/dL   Albumin 3.3 (L) 3.5 - 5.0 g/dL   AST 19 15 - 41 U/L   ALT 13 0 - 44 U/L   Alkaline Phosphatase 105 38 - 126 U/L   Total Bilirubin 0.4 0.3 - 1.2 mg/dL   GFR calc non Af Amer >60 >60 mL/min   GFR calc Af Amer >60 >60 mL/min   Anion gap 11 5 - 15  Protein / creatinine ratio, urine     Status: None   Collection Time: 05/24/19  7:58 PM  Result Value Ref Range   Creatinine, Urine 123 mg/dL   Total Protein, Urine 18 mg/dL   Protein Creatinine Ratio 0.15 0.00 - 0.15 mg/mg[Cre]  Fetal fibronectin     Status: None   Collection Time: 05/24/19  7:58 PM  Result Value Ref Range   Fetal Fibronectin NEGATIVE NEGATIVE  CBC     Status: Abnormal   Collection Time: 05/24/19  8:32 PM  Result Value Ref Range   WBC 16.8 (H) 4.0 - 10.5 K/uL   RBC 3.63 (L) 3.87 - 5.11 MIL/uL   Hemoglobin 10.8 (L) 12.0 - 15.0 g/dL   HCT 32.2 (L) 36.0 - 46.0 %   MCV 88.7 80.0 - 100.0 fL   MCH 29.8 26.0 - 34.0 pg   MCHC 33.5 30.0 - 36.0 g/dL   RDW 14.4 11.5 - 15.5 %   Platelets 349 150 - 400 K/uL   nRBC 0.0 0.0 - 0.2 %  Chlamydia/NGC rt PCR ([place] only)     Status: None   Collection Time: 05/24/19  8:33 PM   Specimen: Vaginal/Rectal  Result Value Ref Range   Specimen source GC/Chlam URINE, RANDOM    Chlamydia Tr NOT DETECTED NOT DETECTED   N gonorrhoeae NOT DETECTED NOT DETECTED  Group B strep by PCR     Status: None   Collection Time: 05/24/19  8:35 PM   Specimen: Vaginal/Rectal; Genital  Result Value Ref Range   Group B strep by PCR NEGATIVE NEGATIVE  Type and screen     Status: None   Collection Time: 05/24/19  9:11 PM  Result Value Ref Range   ABO/RH(D) O POS    Antibody Screen NEG    Sample Expiration      05/27/2019,2359 Performed at [facility], [address] [place], [place] [address]    Procedures: none  Discharge  Condition: stable  Disposition: Discharge disposition: 01-Home or Self Care       Diet: Regular diet  Discharge Activity: No prolonged standing or walking. Note given to keep out of work 05/25/2019  Discharge Instructions    Discharge patient   Complete by: As directed    Discharge disposition: 01-Home or Self Care   Discharge patient date: 05/25/2019     Allergies as of  05/25/2019      Reactions   Codeine Hives, Shortness Of Breath   Penicillins Hives, Shortness Of Breath      Medication List    TAKE these medications   aspirin 81 MG chewable tablet Chew 81 mg by mouth daily.   cetirizine 10 MG tablet Commonly known as: ZYRTEC Take 10 mg by mouth daily.   ferrous sulfate 325 (65 FE) MG tablet Commonly known as: FerrouSul Take 1 tablet (325 mg total) by mouth daily with breakfast.   fluticasone 50 MCG/ACT nasal spray Commonly known as: FLONASE Place into both nostrils daily.   hydrocortisone 2.5 % rectal cream Commonly known as: Anusol-HC Place 1 application rectally 2 (two) times daily.   labetalol 100 MG tablet Commonly known as: NORMODYNE Take 1 tablet (100 mg total) by mouth 2 (two) times daily. What changed:   medication strength  when to take this   nicotine 14 mg/24hr patch Commonly known as: NICODERM CQ - dosed in mg/24 hours Place 14 mg onto the skin daily.   omeprazole 20 MG capsule Commonly known as: PRILOSEC Take 1 capsule (20 mg total) by mouth daily.   prenatal vitamin w/FE, FA 27-1 MG Tabs tablet Prenatal Plus (calcium carbonate) 27 mg iron-1 mg tablet  TAKE 1 TABLET BY MOUTH DAILY WHILE TRYING TO CONCEIVE, PREGNANT & BREASFEEDING   triamcinolone cream 0.5 % Commonly known as: KENALOG Apply 1 application topically 3 (three) times daily as needed.        Total time spent taking care of this patient: 45 minutes  Signed: Farrel Conners 05/25/2019, 2:36 AM

## 2019-05-25 NOTE — OB Triage Note (Signed)
Patient came in for observation for preterm labor evaluation and possible preecalmpsia. Patient reports uterine contractions since 0800 when she woke up. Patient also reports vaginal pressure during contractions. Patient rates pain 8/10. Patient denies leaking of fluid and denies vaginal bleeding and spotting. Patient reports +FM. Vital signs unstable with elevated blood pressure of 150/100 and patient is afebrile. FHR baseline 140 with moderate variability with accelerations 10 x 10and no decelerations. Patient in room by herself.  Will continue to monitor.

## 2019-05-25 NOTE — Telephone Encounter (Signed)
I was able to add patient on ultrasound at 4:30 on 05/28/19. Please place order

## 2019-05-26 ENCOUNTER — Encounter: Payer: Medicaid Other | Admitting: Certified Nurse Midwife

## 2019-05-26 ENCOUNTER — Other Ambulatory Visit: Payer: Self-pay | Admitting: Certified Nurse Midwife

## 2019-05-26 ENCOUNTER — Other Ambulatory Visit: Payer: Medicaid Other

## 2019-05-26 DIAGNOSIS — O163 Unspecified maternal hypertension, third trimester: Secondary | ICD-10-CM

## 2019-05-26 LAB — URINE CULTURE
Culture: NO GROWTH
Special Requests: NORMAL

## 2019-05-26 NOTE — Telephone Encounter (Signed)
Done

## 2019-05-28 ENCOUNTER — Ambulatory Visit (INDEPENDENT_AMBULATORY_CARE_PROVIDER_SITE_OTHER): Payer: Medicaid Other | Admitting: Certified Nurse Midwife

## 2019-05-28 ENCOUNTER — Ambulatory Visit (INDEPENDENT_AMBULATORY_CARE_PROVIDER_SITE_OTHER): Payer: Medicaid Other

## 2019-05-28 ENCOUNTER — Encounter: Payer: Medicaid Other | Admitting: Certified Nurse Midwife

## 2019-05-28 ENCOUNTER — Other Ambulatory Visit: Payer: Medicaid Other

## 2019-05-28 ENCOUNTER — Other Ambulatory Visit: Payer: Self-pay

## 2019-05-28 VITALS — BP 140/80 | Wt 187.0 lb

## 2019-05-28 DIAGNOSIS — O0993 Supervision of high risk pregnancy, unspecified, third trimester: Secondary | ICD-10-CM | POA: Diagnosis not present

## 2019-05-28 DIAGNOSIS — O10913 Unspecified pre-existing hypertension complicating pregnancy, third trimester: Secondary | ICD-10-CM

## 2019-05-28 DIAGNOSIS — O163 Unspecified maternal hypertension, third trimester: Secondary | ICD-10-CM

## 2019-05-28 DIAGNOSIS — O403XX0 Polyhydramnios, third trimester, not applicable or unspecified: Secondary | ICD-10-CM

## 2019-05-28 DIAGNOSIS — Z3689 Encounter for other specified antenatal screening: Secondary | ICD-10-CM | POA: Diagnosis not present

## 2019-05-28 DIAGNOSIS — Z3A33 33 weeks gestation of pregnancy: Secondary | ICD-10-CM | POA: Diagnosis not present

## 2019-05-28 DIAGNOSIS — O0990 Supervision of high risk pregnancy, unspecified, unspecified trimester: Secondary | ICD-10-CM

## 2019-05-28 NOTE — Progress Notes (Signed)
ROB/US/NST C/o pressure, contractions

## 2019-05-29 DIAGNOSIS — O403XX0 Polyhydramnios, third trimester, not applicable or unspecified: Secondary | ICD-10-CM | POA: Insufficient documentation

## 2019-05-29 NOTE — Progress Notes (Signed)
HROB at 33wk4d: CHTN/ AMA. Seen in L&D earlier this week for contractions and elevated blood pressures. Contractions resolved with IV hydration and terbutaline x2 doses. Cervix unchanged at FT/firm/ 50%/ -3. She declined betamethasone at that time. Blood pressures normalized when contraction pain improved. There were no severe range blood pressures and preeclampsia labs were normal. She continues on labetalol 100 mgm BID.  She reports some contractions today, mild, do not last very long. Has been under a lot of stress recently. Son incarcerated and step father is in hospital and terminal. Baby active. Has not drunk very much today, and has not eaten lunch.  Exam: BP 140/80. Did not provide urine for dipstick AFI 24.4cm/ cephalic presentation. Growth scan on 12/23 with EFW 50th% NST: prolonged NST: initially baseline 150 with accelerations to 160 with active fetus. After eating and drinking, baseline changed to 140 with accelerations to 150s (15 x 15 accelerations), with moderate variability Cervix unchanged: FT/40%/firm/baby OOP  A: IUP at 33wk4d with CHTN on labetalol 100 mgm BID Mild polyhydramnios Reactive NST Mild contractions-no cervical change  P: Continue twice weekly NST Weekly AFI Continue labetalol 100 mgm BID Declines TDAP Uncertain about contraception postpartum Breast and bottle  Farrel Conners, CNM

## 2019-06-01 ENCOUNTER — Ambulatory Visit (INDEPENDENT_AMBULATORY_CARE_PROVIDER_SITE_OTHER): Payer: Medicaid Other | Admitting: Certified Nurse Midwife

## 2019-06-01 ENCOUNTER — Observation Stay
Admission: EM | Admit: 2019-06-01 | Discharge: 2019-06-01 | Disposition: A | Discharge status: Home / Self Care | Payer: Medicaid Other | Attending: Obstetrics and Gynecology | Admitting: Obstetrics and Gynecology

## 2019-06-01 ENCOUNTER — Observation Stay: Payer: Medicaid Other

## 2019-06-01 ENCOUNTER — Other Ambulatory Visit: Payer: Self-pay

## 2019-06-01 VITALS — BP 140/90 | Wt 188.0 lb

## 2019-06-01 DIAGNOSIS — O288 Other abnormal findings on antenatal screening of mother: Secondary | ICD-10-CM | POA: Diagnosis present

## 2019-06-01 DIAGNOSIS — O368190 Decreased fetal movements, unspecified trimester, not applicable or unspecified: Secondary | ICD-10-CM | POA: Diagnosis present

## 2019-06-01 DIAGNOSIS — O0943 Supervision of pregnancy with grand multiparity, third trimester: Secondary | ICD-10-CM

## 2019-06-01 DIAGNOSIS — O10013 Pre-existing essential hypertension complicating pregnancy, third trimester: Secondary | ICD-10-CM | POA: Diagnosis not present

## 2019-06-01 DIAGNOSIS — O403XX0 Polyhydramnios, third trimester, not applicable or unspecified: Secondary | ICD-10-CM | POA: Diagnosis not present

## 2019-06-01 DIAGNOSIS — O10913 Unspecified pre-existing hypertension complicating pregnancy, third trimester: Secondary | ICD-10-CM

## 2019-06-01 DIAGNOSIS — O0940 Supervision of pregnancy with grand multiparity, unspecified trimester: Secondary | ICD-10-CM

## 2019-06-01 DIAGNOSIS — Z3A34 34 weeks gestation of pregnancy: Secondary | ICD-10-CM

## 2019-06-01 DIAGNOSIS — O0990 Supervision of high risk pregnancy, unspecified, unspecified trimester: Secondary | ICD-10-CM

## 2019-06-01 DIAGNOSIS — Z3A00 Weeks of gestation of pregnancy not specified: Secondary | ICD-10-CM | POA: Insufficient documentation

## 2019-06-01 DIAGNOSIS — O0993 Supervision of high risk pregnancy, unspecified, third trimester: Secondary | ICD-10-CM

## 2019-06-01 DIAGNOSIS — O368330 Maternal care for abnormalities of the fetal heart rate or rhythm, third trimester, not applicable or unspecified: Secondary | ICD-10-CM | POA: Diagnosis not present

## 2019-06-01 DIAGNOSIS — O163 Unspecified maternal hypertension, third trimester: Secondary | ICD-10-CM

## 2019-06-01 LAB — CBC
HCT: 31 % — ABNORMAL LOW (ref 36.0–46.0)
Hemoglobin: 10.6 g/dL — ABNORMAL LOW (ref 12.0–15.0)
MCH: 29.9 pg (ref 26.0–34.0)
MCHC: 34.2 g/dL (ref 30.0–36.0)
MCV: 87.3 fL (ref 80.0–100.0)
Platelets: 333 10*3/uL (ref 150–400)
RBC: 3.55 MIL/uL — ABNORMAL LOW (ref 3.87–5.11)
RDW: 14.2 % (ref 11.5–15.5)
WBC: 13.5 10*3/uL — ABNORMAL HIGH (ref 4.0–10.5)
nRBC: 0 % (ref 0.0–0.2)

## 2019-06-01 LAB — COMPREHENSIVE METABOLIC PANEL
ALT: 12 U/L (ref 0–44)
AST: 16 U/L (ref 15–41)
Albumin: 3 g/dL — ABNORMAL LOW (ref 3.5–5.0)
Alkaline Phosphatase: 105 U/L (ref 38–126)
Anion gap: 8 (ref 5–15)
BUN: 7 mg/dL (ref 6–20)
CO2: 20 mmol/L — ABNORMAL LOW (ref 22–32)
Calcium: 8.6 mg/dL — ABNORMAL LOW (ref 8.9–10.3)
Chloride: 108 mmol/L (ref 98–111)
Creatinine, Ser: 0.78 mg/dL (ref 0.44–1.00)
GFR calc Af Amer: >60 mL/min (ref >60)
GFR calc non Af Amer: >60 mL/min (ref >60)
Glucose, Bld: 119 mg/dL — ABNORMAL HIGH (ref 70–99)
Potassium: 3.8 mmol/L (ref 3.5–5.1)
Sodium: 136 mmol/L (ref 135–145)
Total Bilirubin: 0.5 mg/dL (ref 0.3–1.2)
Total Protein: 6.8 g/dL (ref 6.5–8.1)

## 2019-06-01 LAB — FETAL NONSTRESS TEST

## 2019-06-01 LAB — PROTEIN / CREATININE RATIO, URINE
Creatinine, Urine: 34 mg/dL
Protein Creatinine Ratio: 0.24 mg/mg{Cre} — ABNORMAL HIGH (ref 0.00–0.15)
Total Protein, Urine: 8 mg/dL

## 2019-06-01 NOTE — H&P (Signed)
H&P  Kelly Huber is an 44 y.o. female.  HPI: she is her for a nonreactive NST in office. She is feeling normal fetal movements. Denies headache. Denies vision changes.   Past Medical History:  Diagnosis Date  . Advanced maternal age in multigravida   . Alcohol intoxication with delirium (HCC) 12/28/2016  . Chronic hypertension   . Gestational diabetes   . Gestational hypertension 02/29/2016  . History of postpartum depression   . Marijuana use     Past Surgical History:  Procedure Laterality Date  . cataract surgery    . dilation and currette      Family History  Problem Relation Age of Onset  . Hypertension Mother   . Bipolar disorder Father   . Schizophrenia Father   . Bipolar disorder Brother        bothers x2    Social History:  reports that she quit smoking about 3 years ago. Her smoking use included cigarettes. She has a 5.00 pack-year smoking history. She has never used smokeless tobacco. She reports current drug use. Drug: Marijuana. She reports that she does not drink alcohol.  Allergies:  Allergies  Allergen Reactions  . Codeine Hives and Shortness Of Breath  . Penicillins Hives and Shortness Of Breath    Medications: I have reviewed the patient's current medications.  No results found for this or any previous visit (from the past 48 hour(s)).  No results found.  Review of Systems  Constitutional: Negative for chills and fever.  HENT: Negative for congestion, hearing loss and sinus pain.   Respiratory: Negative for cough, shortness of breath and wheezing.   Cardiovascular: Negative for chest pain, palpitations and leg swelling.  Gastrointestinal: Negative for abdominal pain, constipation, diarrhea, nausea and vomiting.  Genitourinary: Negative for dysuria, flank pain, frequency, hematuria and urgency.  Musculoskeletal: Negative for back pain.  Skin: Negative for rash.  Neurological: Negative for dizziness and headaches.  Psychiatric/Behavioral:  Negative for suicidal ideas. The patient is not nervous/anxious.    Blood pressure (!) 143/86, pulse 80, temperature 98.8 F (37.1 C), temperature source Oral, resp. rate 14, last menstrual period 09/11/2018, unknown if currently breastfeeding. Physical Exam  Nursing note and vitals reviewed. Constitutional: She is oriented to person, place, and time. She appears well-developed and well-nourished.  HENT:  Head: Normocephalic and atraumatic.  Cardiovascular: Normal rate and regular rhythm.  Respiratory: Effort normal and breath sounds normal.  GI: Soft. Bowel sounds are normal.  Musculoskeletal:        General: Normal range of motion.  Neurological: She is alert and oriented to person, place, and time.  Skin: Skin is warm and dry.  Psychiatric: She has a normal mood and affect. Her behavior is normal. Judgment and thought content normal.    Assessment/Plan:  Sent from the office for nonreactive NST.  NST on the labor and delivery for was initially nonreactive.  She was sent for a biophysical profile.  Biophysical profile was 8 out of 10.  With return to labor and delivery fetal strip was reactive tracing with 140 baseline 15 x 15 accelerations no decelerations moderate variability.  Patient was able to be discharged home in normal condition preeclampsia labs were obtained and were within normal limits. Close office follow up planned.  Kelly Huber 06/01/2019, 12:39 PM

## 2019-06-01 NOTE — OB Triage Note (Signed)
Pt sent over from MD office for further evaluation and NST.

## 2019-06-01 NOTE — Discharge Summary (Signed)
Physician Discharge Summary   Patient ID: Kelly Huber [id number] 44 y.o. 1975/06/12  Admit date: 06/01/2019  Discharge date and time: No discharge date for patient encounter.   Admitting Physician: Homero Fellers, MD   Discharge Physician: Adrian Prows MD   Admission Diagnoses: NST (non-stress test) nonreactive [O28.8]  Discharge Diagnoses: BPP 8/10  Admission Condition: good  Discharged Condition: good  Indication for Admission:  [facility] NST  Hospital Course: Sent from the office for nonreactive NST.  NST on the labor and delivery for was initially nonreactive.  She was sent for a biophysical profile.  Biophysical profile was 8 out of 10.  With return to labor and delivery fetal strip was reactive tracing with 140 baseline 15 x 15 accelerations no decelerations moderate variability.  Patient was able to be discharged home in normal condition preeclampsia labs were obtained and were within normal limits. Close office follow up planned.   Consults: None  Significant Diagnostic Studies: [place]  Treatments: none  Discharge Exam: BP 133/81   Pulse 82   Temp 98.8 F (37.1 C) (Oral)   Resp 14   LMP 09/11/2018 (Exact Date)   General Appearance:    Alert, cooperative, no distress, appears stated age  Head:    Normocephalic, without obvious abnormality, atraumatic  Eyes:    PERRL, conjunctiva/corneas clear, EOM's intact, fundi    benign, both eyes  Ears:    Normal TM's and external ear canals, both ears  Nose:   Nares normal, septum midline, mucosa normal, no drainage    or sinus tenderness  Throat:   Lips, mucosa, and tongue normal; teeth and gums normal  Neck:   Supple, symmetrical, trachea midline, no adenopathy;    thyroid:  no enlargement/tenderness/nodules; no carotid   bruit or JVD  Back:     Symmetric, no curvature, ROM normal, no CVA tenderness  Lungs:     Clear to auscultation bilaterally, respirations unlabored  Chest Wall:    No tenderness or  deformity   Heart:    Regular rate and rhythm, S1 and S2 normal, no murmur, rub   or gallop  Breast Exam:    No tenderness, masses, or nipple abnormality  Abdomen:     Soft, non-tender, bowel sounds active all four quadrants,    no masses, no organomegaly  Genitalia:    Normal female without lesion, discharge or tenderness  Rectal:    Normal tone, normal prostate, no masses or tenderness;   guaiac negative stool  Extremities:   Extremities normal, atraumatic, no cyanosis or edema  Pulses:   2+ and symmetric all extremities  Skin:   Skin color, texture, turgor normal, no rashes or lesions  Lymph nodes:   Cervical, supraclavicular, and axillary nodes normal  Neurologic:   CNII-XII intact, normal strength, sensation and reflexes    throughout    Disposition:  Discharge disposition: 01-Home or Self Care       Patient Instructions:  Allergies as of 06/01/2019      Reactions   Codeine Hives, Shortness Of Breath   Penicillins Hives, Shortness Of Breath      Medication List    TAKE these medications   aspirin 81 MG chewable tablet Chew 81 mg by mouth daily.   cetirizine 10 MG tablet Commonly known as: ZYRTEC Take 10 mg by mouth daily.   ferrous sulfate 325 (65 FE) MG tablet Commonly known as: FerrouSul Take 1 tablet (325 mg total) by mouth daily with breakfast.   fluticasone 50 MCG/ACT  nasal spray Commonly known as: FLONASE Place into both nostrils daily.   hydrocortisone 2.5 % rectal cream Commonly known as: Anusol-HC Place 1 application rectally 2 (two) times daily.   labetalol 100 MG tablet Commonly known as: NORMODYNE Take 1 tablet (100 mg total) by mouth 2 (two) times daily.   nicotine 14 mg/24hr patch Commonly known as: NICODERM CQ - dosed in mg/24 hours Place 14 mg onto the skin daily.   omeprazole 20 MG capsule Commonly known as: PRILOSEC Take 1 capsule (20 mg total) by mouth daily.   prenatal vitamin w/FE, FA 27-1 MG Tabs tablet Prenatal Plus (calcium  carbonate) 27 mg iron-1 mg tablet  TAKE 1 TABLET BY MOUTH DAILY WHILE TRYING TO CONCEIVE, PREGNANT & BREASFEEDING   triamcinolone cream 0.5 % Commonly known as: KENALOG Apply 1 application topically 3 (three) times daily as needed.      Activity: activity as tolerated Diet: regular diet Wound Care: none needed  Follow-up with [facility]  in 2 days.  Signed: Natale Milch 06/01/2019 3:21 PM

## 2019-06-01 NOTE — Progress Notes (Signed)
C/o pain/pressure - comes and goes; pelvic bone hurts.rj

## 2019-06-02 NOTE — Progress Notes (Signed)
HROB at 34wk1d with CHTN/ AMA/ mild polyhydramnios for NST: Complains of irregular contractions and pelvic pain/ pubic bone pain. Baby active. DId not take labetalol this Am yet. Has not eaten breakfast, but is drinking coffee. States has stopped using MJ and has not been smoking cigarettes. Had a headache over the weekend x 1 day that resolved with Tylenol. +mucous discharge. No current headache, SOB, CP, visual changes. BP 140/90, unable to leave urine specimen NST: nonreactive after 50 minutes despite Sprite, crackers and vibroacoustic stimulation. Initial variability minimal, moderate variability after food. Baseline 145-150\    Plan: Instructed to go home, take labetalol and eat breakfast, then go to L&D for further monitoring. If has a reactive NST, has follow up appointment on 15 Jan for ultrasound and NST  Farrel Conners, [place]

## 2019-06-04 ENCOUNTER — Ambulatory Visit (INDEPENDENT_AMBULATORY_CARE_PROVIDER_SITE_OTHER): Payer: Medicaid Other

## 2019-06-04 ENCOUNTER — Encounter: Payer: Self-pay | Admitting: Obstetrics & Gynecology

## 2019-06-04 ENCOUNTER — Ambulatory Visit (INDEPENDENT_AMBULATORY_CARE_PROVIDER_SITE_OTHER): Payer: Medicaid Other | Admitting: Obstetrics & Gynecology

## 2019-06-04 ENCOUNTER — Other Ambulatory Visit: Payer: Self-pay

## 2019-06-04 ENCOUNTER — Other Ambulatory Visit: Payer: Self-pay | Admitting: Obstetrics & Gynecology

## 2019-06-04 VITALS — BP 130/80 | Wt 187.0 lb

## 2019-06-04 DIAGNOSIS — Z3689 Encounter for other specified antenatal screening: Secondary | ICD-10-CM | POA: Diagnosis not present

## 2019-06-04 DIAGNOSIS — O0993 Supervision of high risk pregnancy, unspecified, third trimester: Secondary | ICD-10-CM

## 2019-06-04 DIAGNOSIS — O163 Unspecified maternal hypertension, third trimester: Secondary | ICD-10-CM

## 2019-06-04 DIAGNOSIS — Z3A34 34 weeks gestation of pregnancy: Secondary | ICD-10-CM

## 2019-06-04 DIAGNOSIS — O0990 Supervision of high risk pregnancy, unspecified, unspecified trimester: Secondary | ICD-10-CM

## 2019-06-04 LAB — POCT URINALYSIS DIPSTICK OB: Glucose, UA: NEGATIVE

## 2019-06-04 NOTE — Patient Instructions (Signed)
Nonstress Test A nonstress test is a procedure that is done during pregnancy in order to check the baby's heartbeat. The procedure can help show if the baby (fetus) is healthy. It is commonly done when:  The baby is past his or her due date.  The pregnancy is high risk.  The baby is moving less than normal.  The mother has lost a pregnancy in the past.  The health care provider suspects a problem with the baby's growth.  There is too much or too little amniotic fluid. The procedure is often done in the third trimester of pregnancy to find out if an early delivery is needed and whether such a delivery is safe. During a nonstress test, the baby's heartbeat is monitored when the baby is resting and when the baby is moving. If the baby is healthy, the heart rate will increase when he or she moves or kicks and will return to normal when he or she rests. Tell a health care provider about:  Any allergies you have.  Any medical conditions you have.  All medicines you are taking, including vitamins, herbs, eye drops, creams, and over-the-counter medicines. What are the risks? There are no risks to you or your baby from a nonstress test. This procedure should not be painful or uncomfortable. What happens before the procedure?  Eat a meal right before the test or as directed by your health care provider. Food may help encourage the baby to move.  Use the restroom right before the test. What happens during the procedure?  Two monitors will be placed on your abdomen. One will record the baby's heart rate and the other will record the contractions of your uterus.  You may be asked to lie down on your side or to sit upright.  You may be given a button to press when you feel your baby move.  Your health care provider will listen to your baby's heartbeat and recorded it. He or she may also watch your baby's heartbeat on a screen.  If the baby seems to be sleeping, you may be asked to drink  some juice or soda, eat a snack, or change positions. The procedure may vary among health care providers and hospitals. What happens after the procedure?  Your health care provider will discuss the test results with you and make recommendations for the future. Depending on the results, your health care provider may order additional tests or another course of action.  If your health care provider gave you any diet or activity instructions, make sure to follow them.  Keep all follow-up visits as told by your health care provider. This is important. Summary  A nonstress test is a procedure that is done during pregnancy in order to check the baby's heartbeat. The procedure can help show if the baby is healthy.  The procedure is often done in the third trimester of pregnancy to find out if an early delivery is needed and whether such a delivery is safe.  During a nonstress test, the baby's heartbeat is monitored when the baby is resting and when the baby is moving. If the baby is healthy, the heart rate will increase when he or she moves or kicks and will return to normal when he or she rests.  Your health care provider will discuss the test results with you and make recommendations for the future. This information is not intended to replace advice given to you by your health care provider. Make sure you discuss any   questions you have with your health care provider. Document Revised: 08/15/2016 Document Reviewed: 08/15/2016 Elsevier Patient Education  2020 [employer] Inc[employer]  

## 2019-06-04 NOTE — Progress Notes (Signed)
Subjective  Fetal Movement? yes Contractions? no Leaking Fluid? no Vaginal Bleeding? no  Objective  BP 130/80   Wt 187 lb (84.8 kg)   LMP 09/11/2018 (Exact Date)   BMI 33.13 kg/m  General: NAD Pumonary: no increased work of breathing Abdomen: gravid, non-tender Extremities: no edema Psychiatric: mood appropriate, affect full  A NST procedure was performed with FHR monitoring and a normal baseline established, appropriate time of 20-40 minutes of evaluation, and accels >2 seen w 15x15 characteristics.  Results show a REACTIVE NST.   Review of ULTRASOUND.    I have personally reviewed images and report of recent ultrasound done at [facility].    Plan of management to be discussed with patient.  Assessment  44 y.o. [id number] at [redacted]w[redacted]d by  07/12/2019, by Ultrasound presenting for routine prenatal visit  Plan   Problem List Items Addressed This Visit      Cardiovascular and Mediastinum   Hypertension affecting pregnancy     Other   Supervision of high risk pregnancy, antepartum    Other Visit Diagnoses    [redacted] weeks gestation of pregnancy    -  Primary   Relevant Orders   POC Urinalysis Dipstick OB (Completed)      NINTH Problems (from 11/25/18 to present)    Problem Noted Resolved   Grand multiparity with antenatal problem, antepartum 02/12/2019 by Malachy Mood, MD No   Overview Signed 02/12/2019  5:14 PM by Malachy Mood, MD    Increased risk of uterine atony and postpartum hemorrhage      Hypertension affecting pregnancy, antepartum 02/12/2019 by Malachy Mood, MD No   Overview Addendum 05/18/2019  2:28 PM by Malachy Mood, MD    [X]  Aspirin 81 mg daily after 12 weeks; discontinue after 36 weeks [X]  baseline labs with CBC, CMP, urine protein/creatinine ratio [X]  no BP meds unless BPs become elevated - on labetalol 100mg  bid [ ]  ultrasound for growth at 27 week 962g 3lbs 2oz c/w 42/9%ile, 31 week 1765g 2lbs 14oz c/w 50.3%ile 36 weeks  Current  antihypertensives:  Labetalol   Baseline and surveillance labs (pulled in from [facility], refresh links as needed)  Lab Results  Component Value Date   PLT 389 11/25/2018   CREATININE 0.88 12/20/2018   AST 54 (H) 12/17/2017   ALT 53 (H) 12/17/2017   PROTCRRATIO 0.29 (H) 02/28/2016   LABPROT 11.8 12/28/2016   PROTEIN24HR 110 12/20/2018    Antenatal Testing CHTN - O10.919  Group I  BP < 140/90, no preeclampsia, AGA,  nml AFV, +/- meds    Group II BP > 140/90, on meds, no preeclampsia, AGA, nml AFV  20-28-34-38  20-24-28-32-35-38  32//2 x wk  28//BPP wkly then 32//2 x wk  40 no meds; 39 meds  PRN or 37  Pre-eclampsia  GHTN - O13.9/Preeclampsia without severe features  - O14.00   Preeclampsia with severe features - O14.10  Q 3-4wks  Q 2 wks  28//BPP wkly then 32//2 x wk  Inpatient  37  PRN or 34         History of postpartum depression, currently pregnant 02/12/2019 by Malachy Mood, MD No   Supervision of high risk pregnancy, antepartum 11/25/2018 by Rexene Agent, CNM No   Overview Addendum 05/29/2019  9:30 AM by Dalia Heading, [place] Prenatal Labs  Dating 8wk [place] Blood type: O/Positive/-- (07/08 1051)   Genetic Screen  NIPS: diploid XY Antibody:Negative (07/08 1051)  Anatomic [place] Normal Rubella: 2.53 (07/08 1051) Varicella:  Immune  GTT Third trimester: 121 RPR: Non Reactive (07/08 1051)   Rhogam Not needed HBsAg: Negative (07/08 1051)   TDaP vaccine        Declines               Flu Shot: HIV: Non Reactive (07/08 1051)   Baby Food          Breast and bottle                      GBS:   Contraception  Pap: NIL 2020  CBB   Pelvis tested to 7lbs 2oz  CS/VBAC N/A Hgb AA  Support Person Ahmad             Hx of preeclampsia, prior pregnancy, currently pregnant 11/25/2018 by Oswaldo Conroy, CNM No     NST 2 x weekly, AFI weekly  BP monitoring  OOW next week  Labetalol daily  IOL 37 weeks, sooner based on control  Contraception  discussed       No estrogen due to cHTN       Does not desire IUD       Breast feeding plans       Prior side effects Depo       So, plan POP initially and consider vasec or BTL  Annamarie Major, MD, [facility], [facility] 06/04/2019  3:02 PM

## 2019-06-08 ENCOUNTER — Other Ambulatory Visit: Payer: Self-pay

## 2019-06-08 ENCOUNTER — Ambulatory Visit: Payer: Medicaid Other | Admitting: Certified Nurse Midwife

## 2019-06-08 VITALS — BP 140/80 | Wt 191.0 lb

## 2019-06-08 DIAGNOSIS — Z3A35 35 weeks gestation of pregnancy: Secondary | ICD-10-CM

## 2019-06-08 LAB — POCT URINALYSIS DIPSTICK OB: Glucose, UA: NEGATIVE

## 2019-06-08 NOTE — Progress Notes (Unsigned)
C/o BP 144/97 at home before coming to appt. Having a lot of pressure pain and back pain.  Having light headaches too.rj

## 2019-06-11 ENCOUNTER — Ambulatory Visit (INDEPENDENT_AMBULATORY_CARE_PROVIDER_SITE_OTHER): Payer: Medicaid Other | Admitting: Obstetrics and Gynecology

## 2019-06-11 ENCOUNTER — Other Ambulatory Visit: Payer: Self-pay

## 2019-06-11 ENCOUNTER — Ambulatory Visit (INDEPENDENT_AMBULATORY_CARE_PROVIDER_SITE_OTHER): Payer: Medicaid Other

## 2019-06-11 ENCOUNTER — Encounter: Payer: Self-pay | Admitting: Obstetrics and Gynecology

## 2019-06-11 ENCOUNTER — Inpatient Hospital Stay
Admission: EM | Admit: 2019-06-11 | Discharge: 2019-06-16 | DRG: 806 | Disposition: A | Discharge status: Home / Self Care | Payer: Medicaid Other | Attending: Obstetrics and Gynecology | Admitting: Obstetrics and Gynecology

## 2019-06-11 VITALS — BP 144/102 | Wt 190.0 lb

## 2019-06-11 DIAGNOSIS — O114 Pre-existing hypertension with pre-eclampsia, complicating childbirth: Secondary | ICD-10-CM | POA: Diagnosis present

## 2019-06-11 DIAGNOSIS — O09293 Supervision of pregnancy with other poor reproductive or obstetric history, third trimester: Secondary | ICD-10-CM

## 2019-06-11 DIAGNOSIS — O99324 Drug use complicating childbirth: Secondary | ICD-10-CM | POA: Diagnosis present

## 2019-06-11 DIAGNOSIS — O0990 Supervision of high risk pregnancy, unspecified, unspecified trimester: Secondary | ICD-10-CM

## 2019-06-11 DIAGNOSIS — O169 Unspecified maternal hypertension, unspecified trimester: Secondary | ICD-10-CM

## 2019-06-11 DIAGNOSIS — O165 Unspecified maternal hypertension, complicating the puerperium: Secondary | ICD-10-CM | POA: Diagnosis not present

## 2019-06-11 DIAGNOSIS — Z8659 Personal history of other mental and behavioral disorders: Secondary | ICD-10-CM

## 2019-06-11 DIAGNOSIS — O09299 Supervision of pregnancy with other poor reproductive or obstetric history, unspecified trimester: Secondary | ICD-10-CM

## 2019-06-11 DIAGNOSIS — D62 Acute posthemorrhagic anemia: Secondary | ICD-10-CM | POA: Diagnosis not present

## 2019-06-11 DIAGNOSIS — Z20822 Contact with and (suspected) exposure to covid-19: Secondary | ICD-10-CM | POA: Diagnosis present

## 2019-06-11 DIAGNOSIS — O403XX0 Polyhydramnios, third trimester, not applicable or unspecified: Secondary | ICD-10-CM | POA: Diagnosis not present

## 2019-06-11 DIAGNOSIS — Z87891 Personal history of nicotine dependence: Secondary | ICD-10-CM | POA: Diagnosis not present

## 2019-06-11 DIAGNOSIS — O1002 Pre-existing essential hypertension complicating childbirth: Secondary | ICD-10-CM | POA: Diagnosis present

## 2019-06-11 DIAGNOSIS — O119 Pre-existing hypertension with pre-eclampsia, unspecified trimester: Secondary | ICD-10-CM | POA: Diagnosis present

## 2019-06-11 DIAGNOSIS — Z3A35 35 weeks gestation of pregnancy: Secondary | ICD-10-CM

## 2019-06-11 DIAGNOSIS — Z880 Allergy status to penicillin: Secondary | ICD-10-CM

## 2019-06-11 DIAGNOSIS — O99891 Other specified diseases and conditions complicating pregnancy: Secondary | ICD-10-CM | POA: Diagnosis not present

## 2019-06-11 DIAGNOSIS — O163 Unspecified maternal hypertension, third trimester: Secondary | ICD-10-CM

## 2019-06-11 DIAGNOSIS — O1410 Severe pre-eclampsia, unspecified trimester: Secondary | ICD-10-CM | POA: Diagnosis present

## 2019-06-11 DIAGNOSIS — F1290 Cannabis use, unspecified, uncomplicated: Secondary | ICD-10-CM | POA: Diagnosis present

## 2019-06-11 DIAGNOSIS — Z3490 Encounter for supervision of normal pregnancy, unspecified, unspecified trimester: Secondary | ICD-10-CM | POA: Diagnosis present

## 2019-06-11 DIAGNOSIS — O1414 Severe pre-eclampsia complicating childbirth: Secondary | ICD-10-CM

## 2019-06-11 DIAGNOSIS — Z3689 Encounter for other specified antenatal screening: Secondary | ICD-10-CM

## 2019-06-11 DIAGNOSIS — O3680X0 Pregnancy with inconclusive fetal viability, not applicable or unspecified: Secondary | ICD-10-CM

## 2019-06-11 DIAGNOSIS — O0993 Supervision of high risk pregnancy, unspecified, third trimester: Secondary | ICD-10-CM

## 2019-06-11 DIAGNOSIS — R030 Elevated blood-pressure reading, without diagnosis of hypertension: Secondary | ICD-10-CM | POA: Diagnosis present

## 2019-06-11 DIAGNOSIS — O9081 Anemia of the puerperium: Secondary | ICD-10-CM | POA: Diagnosis not present

## 2019-06-11 DIAGNOSIS — O09523 Supervision of elderly multigravida, third trimester: Secondary | ICD-10-CM

## 2019-06-11 DIAGNOSIS — O99013 Anemia complicating pregnancy, third trimester: Secondary | ICD-10-CM

## 2019-06-11 DIAGNOSIS — O1092 Unspecified pre-existing hypertension complicating childbirth: Secondary | ICD-10-CM | POA: Diagnosis not present

## 2019-06-11 DIAGNOSIS — O09529 Supervision of elderly multigravida, unspecified trimester: Secondary | ICD-10-CM

## 2019-06-11 DIAGNOSIS — O0940 Supervision of pregnancy with grand multiparity, unspecified trimester: Secondary | ICD-10-CM

## 2019-06-11 DIAGNOSIS — O1413 Severe pre-eclampsia, third trimester: Secondary | ICD-10-CM

## 2019-06-11 LAB — COMPREHENSIVE METABOLIC PANEL
ALT: 13 U/L (ref 0–44)
AST: 16 U/L (ref 15–41)
Albumin: 3.2 g/dL — ABNORMAL LOW (ref 3.5–5.0)
Alkaline Phosphatase: 132 U/L — ABNORMAL HIGH (ref 38–126)
Anion gap: 10 (ref 5–15)
BUN: 10 mg/dL (ref 6–20)
CO2: 18 mmol/L — ABNORMAL LOW (ref 22–32)
Calcium: 9.4 mg/dL (ref 8.9–10.3)
Chloride: 107 mmol/L (ref 98–111)
Creatinine, Ser: 0.74 mg/dL (ref 0.44–1.00)
GFR calc Af Amer: >60 mL/min (ref >60)
GFR calc non Af Amer: >60 mL/min (ref >60)
Glucose, Bld: 81 mg/dL (ref 70–99)
Potassium: 3.7 mmol/L (ref 3.5–5.1)
Sodium: 135 mmol/L (ref 135–145)
Total Bilirubin: 0.6 mg/dL (ref 0.3–1.2)
Total Protein: 7 g/dL (ref 6.5–8.1)

## 2019-06-11 LAB — FETAL NONSTRESS TEST

## 2019-06-11 LAB — PROTEIN / CREATININE RATIO, URINE
Creatinine, Urine: 24 mg/dL
Protein Creatinine Ratio: 0.67 mg/mg{Cre} — ABNORMAL HIGH (ref 0.00–0.15)
Total Protein, Urine: 16 mg/dL

## 2019-06-11 LAB — POCT URINALYSIS DIPSTICK OB
Glucose, UA: NEGATIVE
POC,PROTEIN,UA: NEGATIVE

## 2019-06-11 LAB — CBC
HCT: 32.8 % — ABNORMAL LOW (ref 36.0–46.0)
Hemoglobin: 11.1 g/dL — ABNORMAL LOW (ref 12.0–15.0)
MCH: 29.5 pg (ref 26.0–34.0)
MCHC: 33.8 g/dL (ref 30.0–36.0)
MCV: 87.2 fL (ref 80.0–100.0)
Platelets: 343 10*3/uL (ref 150–400)
RBC: 3.76 MIL/uL — ABNORMAL LOW (ref 3.87–5.11)
RDW: 14.6 % (ref 11.5–15.5)
WBC: 13.6 10*3/uL — ABNORMAL HIGH (ref 4.0–10.5)
nRBC: 0 % (ref 0.0–0.2)

## 2019-06-11 LAB — RUPTURE OF MEMBRANE (ROM)PLUS: Rom Plus: NEGATIVE

## 2019-06-11 LAB — RAPID HIV SCREEN (HIV 1/2 AB+AG)
HIV 1/2 Antibodies: NONREACTIVE
HIV-1 P24 Antigen - HIV24: NONREACTIVE

## 2019-06-11 LAB — RESPIRATORY PANEL BY RT PCR (FLU A&B, COVID)
Influenza A by PCR: NEGATIVE
Influenza B by PCR: NEGATIVE
SARS Coronavirus 2 by RT PCR: NEGATIVE

## 2019-06-11 LAB — TYPE AND SCREEN
ABO/RH(D): O POS
Antibody Screen: NEGATIVE

## 2019-06-11 MED ORDER — HYDRALAZINE HCL 20 MG/ML IJ SOLN: 10.0000 mg | INTRAMUSCULAR | Status: DC | PRN

## 2019-06-11 MED ORDER — MISOPROSTOL 25 MCG QUARTER TABLET
25.0000 ug | ORAL_TABLET | ORAL | Status: DC | PRN
  Administered 2019-06-11 – 2019-06-12 (×2): 25 ug via VAGINAL
  Filled 2019-06-11 (×2): qty 1

## 2019-06-11 MED ORDER — MISOPROSTOL 200 MCG PO TABS
ORAL_TABLET | ORAL | Status: AC
  Filled 2019-06-11: qty 4

## 2019-06-11 MED ORDER — MAGNESIUM SULFATE 40 GM/1000ML IV SOLN: 2.0000 g/h | INTRAVENOUS | Status: DC

## 2019-06-11 MED ORDER — LABETALOL HCL 5 MG/ML IV SOLN
40.0000 mg | INTRAVENOUS | Status: DC | PRN
  Administered 2019-06-14: 40 mg via INTRAVENOUS
  Filled 2019-06-11: qty 8

## 2019-06-11 MED ORDER — LACTATED RINGERS IV SOLN
500.0000 mL | INTRAVENOUS | Status: DC | PRN
  Administered 2019-06-12: 500 mL via INTRAVENOUS

## 2019-06-11 MED ORDER — SOD CITRATE-CITRIC ACID 500-334 MG/5ML PO SOLN
30.0000 mL | ORAL | Status: DC | PRN
  Administered 2019-06-11: 30 mL via ORAL
  Filled 2019-06-11: qty 30

## 2019-06-11 MED ORDER — MAGNESIUM SULFATE BOLUS VIA INFUSION
4.0000 g | Freq: Once | INTRAVENOUS | Status: DC
  Filled 2019-06-11: qty 1000

## 2019-06-11 MED ORDER — AMMONIA AROMATIC IN INHA
RESPIRATORY_TRACT | Status: AC
  Filled 2019-06-11: qty 10

## 2019-06-11 MED ORDER — FENTANYL CITRATE (PF) 100 MCG/2ML IJ SOLN
50.0000 ug | INTRAMUSCULAR | Status: DC | PRN
  Administered 2019-06-11 – 2019-06-12 (×7): 100 ug via INTRAVENOUS
  Filled 2019-06-11 (×7): qty 2

## 2019-06-11 MED ORDER — LACTATED RINGERS IV SOLN: INTRAVENOUS | Status: DC

## 2019-06-11 MED ORDER — MAGNESIUM SULFATE 40 GM/1000ML IV SOLN
2.0000 g/h | INTRAVENOUS | Status: DC
  Administered 2019-06-12 – 2019-06-13 (×2): 2 g/h via INTRAVENOUS
  Filled 2019-06-11 (×2): qty 1000

## 2019-06-11 MED ORDER — MAGNESIUM SULFATE 40 GM/1000ML IV SOLN
INTRAVENOUS | Status: AC
  Administered 2019-06-11: 4 g via INTRAVENOUS
  Filled 2019-06-11: qty 1000

## 2019-06-11 MED ORDER — LABETALOL HCL 5 MG/ML IV SOLN
20.0000 mg | INTRAVENOUS | Status: DC | PRN
  Administered 2019-06-11 – 2019-06-15 (×3): 20 mg via INTRAVENOUS
  Filled 2019-06-11 (×3): qty 4

## 2019-06-11 MED ORDER — LABETALOL HCL 5 MG/ML IV SOLN: 80.0000 mg | INTRAVENOUS | Status: DC | PRN

## 2019-06-11 MED ORDER — LIDOCAINE HCL (PF) 1 % IJ SOLN
INTRAMUSCULAR | Status: AC
  Filled 2019-06-11: qty 30

## 2019-06-11 MED ORDER — OXYTOCIN 40 UNITS IN NORMAL SALINE INFUSION - SIMPLE MED
1.0000 m[IU]/min | INTRAVENOUS | Status: DC
  Administered 2019-06-12: 2 m[IU]/min via INTRAVENOUS
  Filled 2019-06-11: qty 1000

## 2019-06-11 MED ORDER — ONDANSETRON HCL 4 MG/2ML IJ SOLN
4.0000 mg | Freq: Four times a day (QID) | INTRAMUSCULAR | Status: DC | PRN
  Administered 2019-06-12: 4 mg via INTRAVENOUS
  Filled 2019-06-11: qty 2

## 2019-06-11 MED ORDER — OXYTOCIN 40 UNITS IN NORMAL SALINE INFUSION - SIMPLE MED: 2.5000 [IU]/h | INTRAVENOUS | Status: DC

## 2019-06-11 MED ORDER — TERBUTALINE SULFATE 1 MG/ML IJ SOLN: 0.2500 mg | Freq: Once | INTRAMUSCULAR | Status: DC | PRN

## 2019-06-11 MED ORDER — ACETAMINOPHEN 500 MG PO TABS
1000.0000 mg | ORAL_TABLET | Freq: Four times a day (QID) | ORAL | Status: DC | PRN
  Administered 2019-06-11 – 2019-06-12 (×3): 1000 mg via ORAL
  Filled 2019-06-11 (×2): qty 2

## 2019-06-11 MED ORDER — OXYTOCIN 10 UNIT/ML IJ SOLN
INTRAMUSCULAR | Status: AC
  Filled 2019-06-11: qty 2

## 2019-06-11 MED ORDER — LIDOCAINE HCL (PF) 1 % IJ SOLN: 30.0000 mL | INTRAMUSCULAR | Status: DC | PRN

## 2019-06-11 MED ORDER — BETAMETHASONE SOD PHOS & ACET 6 (3-3) MG/ML IJ SUSP
12.0000 mg | INTRAMUSCULAR | Status: DC
  Administered 2019-06-11: 12 mg via INTRAMUSCULAR
  Filled 2019-06-11: qty 5

## 2019-06-11 MED ORDER — ACETAMINOPHEN 500 MG PO TABS
ORAL_TABLET | ORAL | Status: AC
  Filled 2019-06-11: qty 2

## 2019-06-11 MED ORDER — OXYTOCIN BOLUS FROM INFUSION
500.0000 mL | Freq: Once | INTRAVENOUS | Status: AC
  Administered 2019-06-12: 500 mL via INTRAVENOUS

## 2019-06-11 MED ORDER — MAGNESIUM SULFATE BOLUS VIA INFUSION
4.0000 g | Freq: Once | INTRAVENOUS | Status: AC
  Filled 2019-06-11: qty 1000

## 2019-06-11 NOTE — Progress Notes (Signed)
Routine Prenatal Care Visit  Subjective  Kelly Huber is a 44 y.o. [id number] at [redacted]w[redacted]d being seen today for ongoing prenatal care.  She is currently monitored for the following issues for this high-risk pregnancy and has Marijuana abuse; HTN (hypertension); Supervision of high risk pregnancy, antepartum; Hx of preeclampsia, prior pregnancy, currently pregnant; Antepartum multigravida of advanced maternal age; Lumbar radiculopathy; [place] multiparity with antenatal problem, antepartum; Hypertension affecting pregnancy, antepartum; History of postpartum depression, currently pregnant; Anemia during pregnancy in third trimester; Fall (on) (from) other stairs and steps, initial encounter; Hypertension affecting pregnancy; Indication for care in labor and delivery, antepartum; Polyhydramnios affecting pregnancy in third trimester; and NST (non-stress test) nonreactive on their problem list.  ----------------------------------------------------------------------------------- Patient reports leaking fluid contractions.    .  .   . Denies leaking of fluid.  ----------------------------------------------------------------------------------- The following portions of the patient's history were reviewed and updated as appropriate: allergies, current medications, past family history, past medical history, past social history, past surgical history and problem list. Problem list updated.   Objective  Blood pressure (!) 144/102, weight 190 lb (86.2 kg), last menstrual period 09/11/2018, unknown if currently breastfeeding. Pregravid weight 158 lb (71.7 kg) Total Weight Gain 32 lb (14.5 kg) Urinalysis:      Fetal Status:           General:  Alert, oriented and cooperative. Patient is in no acute distress.  Skin: Skin is warm and dry. No rash noted.   Cardiovascular: Normal heart rate noted  Respiratory: Normal respiratory effort, no problems with respiration noted  Abdomen: Soft, gravid, appropriate for  gestational age.       Pelvic:  Cervical exam deferred        Extremities: Normal range of motion.     ental Status: Normal mood and affect. Normal behavior. Normal judgment and thought content.   US OB Limited  Result Date: 06/11/2019 Patient Name: Kelly Huber DOB: January 16, 1976 MRN: [id number] ULTRASOUND REPORT Location: [facility] Date of Service: 06/11/2019 Indications:AFI Findings: Nelda [place] intrauterine pregnancy is visualized with FHR at 146 BPM. Fetal presentation is Cephalic. Placenta: anterior. Grade: 3 AFI: 15.7 cm Impression: 1. [redacted]w[redacted]d Viable Singleton Intrauterine pregnancy dated by previously established criteria. 2. AFI is 15.7 cm. Recommendations: 1.Clinical correlation with the patient's History and Physical Exam. Gweneth Dimitri, RT There is a singleton gestation with normal amniotic fluid volume. The visualized fetal anatomical survey appears within normal limits within the resolution of ultrasound as described above.  It must be noted that a normal ultrasound is unable to rule out fetal aneuploidy.  Malachy Mood, MD, [facility], [facility] 06/11/2019, 3:02 PM   US OB Limited  Result Date: 06/08/2019 Patient Name: Kelly Huber DOB: 12-Apr-1976 MRN: [id number] ULTRASOUND REPORT Location: [facility] Date of Service: 06/04/2019 Indications:AFI Findings: Nelda [place] intrauterine pregnancy is visualized with FHR at 134 BPM. Fetal presentation is Cephalic. Placenta: anterior. Grade: 2 AFI: 19.4 cm Impression: 1. [redacted]w[redacted]d Viable Singleton Intrauterine pregnancy dated by previously established criteria. 2. AFI is 19.4 cm. Recommendations: 1.Clinical correlation with the patient's History and Physical Exam. Gweneth Dimitri, RT Review of ULTRASOUND.    I have personally reviewed images and report of recent ultrasound done at [facility].    Plan of management to be discussed with patient. Barnett Applebaum, MD, [facility], [facility] 06/08/2019   4:15 PM  US OB Limited  Result Date: 06/01/2019 CLINICAL DATA:  Nonreactive nonstress test. EXAM: LIMITED OBSTETRIC ULTRASOUND AND BIOPHYSICAL  PROFILE FINDINGS: Number of Fetuses: 1 Heart Rate:  152 bpm Movement: Yes Presentation: Cephalic Placental Location: Anterior Previa: No Amniotic Fluid (Subjective):  Within normal limits. AFI: 16.2 cm MATERNAL FINDINGS: Cervix:  Appears closed. Uterus/Adnexae: No abnormality visualized. Movement:  2  Time: 14 minutes minutes Breathing: 2 Tone:  2 Amniotic Fluid: 2 Total Score:  8 IMPRESSION: Single living intrauterine fetus in cephalic presentation. Normal amniotic fluid volume. Biophysical profile score of 8/8. Biophysical profile score is  out of 8. This exam is performed on an emergent basis and does not comprehensively evaluate fetal size, dating, or anatomy; follow-up complete OB US should be considered if further fetal assessment is warranted. Electronically Signed   By: Danae Orleans M.D.   On: 06/01/2019 13:43   US OB Limited (AFI,Presentation,HR) [facility]  Result Date: 05/28/2019 Patient Name: Kelly Huber DOB: [birth year] MRN: [id number] ULTRASOUND REPORT Location: [facility] Date of Service: 05/28/2019 Indications:AFI Findings: [place] intrauterine pregnancy is visualized with FHR at 148 BPM. Fetal presentation is Cephalic. Placenta: anterior. Grade: 2 AFI: 24.4 cm Impression: 1. [redacted]w[redacted]d Viable Singleton Intrauterine pregnancy dated by previously established criteria. 2. AFI is 24.4 cm. Recommendations: 1.Clinical correlation with the patient's History and Physical Exam. Deanna Artis, RT There is a singleton gestation with mild polyhydramnios. The fetal biometry correlates with established dating.  Limited fetal anatomy was performed.The visualized fetal anatomical survey appears within normal limits within the resolution of ultrasound as described above.  It must be noted that a normal ultrasound is unable to rule out fetal aneuploidy.  Polyhydramnios  is the increase in amniotic fluid volume around the fetus describes as single deepest vertical pocket (SDP) of 8cm or amniotic fluid index (AFI) of >24.0cm, and affects approximately 1-2% of singleton pregnancies.  Previous research studies have used an amniotic fluid index cut of of >25cm, however 24.0cm represents the 97.5%ile at all gestational ages.  Polyhydramnios is further subclassified as mild 24.0-29.9cm or SDP 8-11cm comprising 65-70% of cases, moderate AFI 30.0-34.9cm or SDP 12-15cm comprising 20% of cases, and severe AFI >35cm or SDP >16cm comprising <15% of cases.  The majority of cases (60-70%) will be idiopathic, with the 2 most common pathologic causes attributable to maternal diabetes or fetal anomalies.  Idiopathic polyhdramnios is associated with an increased risk of macrosomia, with birth weight >4000g in 15-30% of cases as opposed to 8% in the general population.  The constellation of polyhydramnios and intrauterine growth restriction raises concern for trisomy 13 or 18.  Amnioreduction should be limited to patient with severe maternal discomfort, dyspnea, or both.  The use of indomethacin purely for the reduction of amniotic fluid volume is not recommended.  Isolated mild idiopathic polyhydramnios does not require additional antepartum surveillance, and delivery is not recommended prior to 39 weeks.  Cases of severe polyhydramnios should consider delivery at a tertiary care center given the high risk of associated fetal anatomic anomalies (20-40%).  [facility] Consult Series #[id number]: Evaluation and management of polyhydramnios October 2018any additional antepartum fetal surveillance Vena Austria, MD, [facility], [facility] 05/28/2019, 3:22 PM   US OB Limited (AFI,Presentation,HR) [facility]  Result Date: 05/18/2019 Patient Name: Shanah Guimaraes DOB: [birth year] MRN: [id number] ULTRASOUND REPORT Location: [facility] Date of Service: 05/18/2019 Indications:AFI Findings:  [place] Jim intrauterine pregnancy is visualized with FHR at 145 BPM. Fetal presentation is Cephalic. Placenta: anterior. Grade: 1 AFI: 14.1 cm Impression: 1. [redacted]w[redacted]d Viable Singleton Intrauterine pregnancy dated by previously established criteria. 2. AFI is 14.1 cm. Recommendations: 1.Clinical correlation  with the patient's History and Physical Exam. Deanna Artis, RT There is a singleton gestation with normal amniotic fluid volume. The visualized fetal anatomy appears within normal limits within the resolution of ultrasound as described above.  It must be noted that a normal ultrasound is unable to rule out fetal aneuploidy.  Vena Austria, MD, [facility], [facility] 05/18/2019, 2:22 PM   US OB Follow Up  Result Date: 05/17/2019 Patient Name: Haruye Lainez DOB: February 09, 1976 MRN: [id number] ULTRASOUND REPORT Location: [facility] Date of Service: 05/12/2019 Indications:growth/afi Findings: [place] Jim intrauterine pregnancy is visualized with FHR at 140 BPM. Biometrics give an (U/S) Gestational age of [redacted]w[redacted]d and an (U/S) EDD of 07/14/2019; this correlates with the clinically established Estimated Date of Delivery: 07/12/19. Fetal presentation is Oblique. Placenta: anterior. Grade: 1 AFI: 22.4 cm Growth percentile is 50.3%.  AC percentile is 84.5%. EFW: 1764 g ( 14 oz )  Impression: 1. [redacted]w[redacted]d Viable Singleton Intrauterine pregnancy previously established criteria. 2. Growth is 50.3 %ile.  AFI is 22.4 cm. Recommendations: 1.Clinical correlation with the patient's History and Physical Exam. Deanna Artis, RT Review of ULTRASOUND.    I have personally reviewed images and report of recent ultrasound done at [facility].    Plan of management to be discussed with patient. Annamarie Major, MD, [facility], [facility] 05/17/2019  5:03 PM  US FETAL BPP WO NON STRESS  Result Date: 06/01/2019 CLINICAL DATA:  Nonreactive nonstress test. EXAM: LIMITED OBSTETRIC ULTRASOUND  AND BIOPHYSICAL PROFILE FINDINGS: Number of Fetuses: 1 Heart Rate:  152 bpm Movement: Yes Presentation: Cephalic Placental Location: Anterior Previa: No Amniotic Fluid (Subjective):  Within normal limits. AFI: 16.2 cm MATERNAL FINDINGS: Cervix:  Appears closed. Uterus/Adnexae: No abnormality visualized. Movement:  2  Time: 14 minutes minutes Breathing: 2 Tone:  2 Amniotic Fluid: 2 Total Score:  8 IMPRESSION: Single living intrauterine fetus in cephalic presentation. Normal amniotic fluid volume. Biophysical profile score of 8/8. Biophysical profile score is  out of 8. This exam is performed on an emergent basis and does not comprehensively evaluate fetal size, dating, or anatomy; follow-up complete OB US should be considered if further fetal assessment is warranted. Electronically Signed   By: Danae Orleans M.D.   On: 06/01/2019 13:43   Baseline: 140 Variability: moderate Accelerations: present Decelerations: absent  Tocometry: none The patient was monitored for 30 minutes, fetal heart rate tracing was deemed reactive, category I tracing,  CPT 59025   Assessment   44 y.o. [id number] at [redacted]w[redacted]d by  07/12/2019, by Ultrasound presenting for routine prenatal visit  Plan   NINTH Problems (from 11/25/18 to present)    Problem Noted Resolved   [place] multiparity with antenatal problem, antepartum 02/12/2019 by Vena Austria, MD No   Overview Signed 02/12/2019  5:14 PM by Vena Austria, MD    Increased risk of uterine atony and postpartum hemorrhage      Hypertension affecting pregnancy, antepartum 02/12/2019 by Vena Austria, MD No   Overview Addendum 05/18/2019  2:28 PM by Vena Austria, MD    [X]  Aspirin 81 mg daily after 12 weeks; discontinue after 36 weeks [X]  baseline labs with CBC, CMP, urine protein/creatinine ratio [X]  no BP meds unless BPs become elevated - on labetalol 100mg  bid [ ]  ultrasound for growth at 27 week 962g 3lbs 2oz c/w 42/9%ile, 31 week 1765g 2lbs 14oz c/w 50.3%ile  36 weeks  Current antihypertensives:  Labetalol   Baseline and surveillance labs (pulled in from , refresh  links as needed)  Lab Results  Component Value Date   PLT 389 11/25/2018   CREATININE 0.88 12/20/2018   AST 54 (H) 12/17/2017   ALT 53 (H) 12/17/2017   PROTCRRATIO 0.29 (H) 02/28/2016   LABPROT 11.8 12/28/2016   PROTEIN24HR 110 12/20/2018    Antenatal Testing CHTN - O10.919  Group I  BP < 140/90, no preeclampsia, AGA,  nml AFV, +/- meds    Group II BP > 140/90, on meds, no preeclampsia, AGA, nml AFV  20-28-34-38  20-24-28-32-35-38  32//2 x wk  28//BPP wkly then 32//2 x wk  40 no meds; 39 meds  PRN or 37  Pre-eclampsia  GHTN - O13.9/Preeclampsia without severe features  - O14.00   Preeclampsia with severe features - O14.10  Q 3-4wks  Q 2 wks  28//BPP wkly then 32//2 x wk  Inpatient  37  PRN or 34         History of postpartum depression, currently pregnant 02/12/2019 by Vena Austria, MD No   Supervision of high risk pregnancy, antepartum 11/25/2018 by Oswaldo Conroy, CNM No   Overview Addendum 05/29/2019  9:30 AM by Farrel Conners, CNM    Clinic [facility] Prenatal Labs  Dating 8wk [place] Blood type: O/Positive/-- (07/08 1051)   Genetic Screen  NIPS: diploid XY Antibody:Negative (07/08 1051)  Anatomic [place] Normal Rubella: 2.53 (07/08 1051) Varicella:  Immune  GTT Third trimester: 121 RPR: Non Reactive (07/08 1051)   Rhogam Not needed HBsAg: Negative (07/08 1051)   TDaP vaccine        Declines               Flu Shot: HIV: Non Reactive (07/08 1051)   Baby Food          Breast and bottle                      GBS:   Contraception  Pap: NIL 2020  CBB   Pelvis tested to 7lbs 2oz  CS/VBAC N/A Hgb AA  Support Person [place]             Hx of preeclampsia, prior pregnancy, currently pregnant 11/25/2018 by Oswaldo Conroy, CNM No       Gestational age appropriate obstetric precautions including but not limited to vaginal bleeding, contractions, leaking  of fluid and fetal movement were reviewed in detail with the patient.   - to L&D for further evalution PIH work up ROM plus   No follow-ups on file.  Vena Austria, MD, [facility], [facility] 06/11/2019, 2:46 PM

## 2019-06-11 NOTE — Progress Notes (Signed)
ROB AFI/NST contractions

## 2019-06-11 NOTE — Progress Notes (Signed)
Asked by Dr Jerene Pitch to speak with Kelly Brodrick regarding her refusal of Betamethasone. Kelly Huber is a 44 y.o. [id number] at [redacted]w[redacted]d presenting for PIH workup. Serologies negative, GBS unknown. Mother asking about benefits of receiving Betamethasone with this pregnancy as she refused Betamethasone with her last pregnancy at 36+ weeks and her son did fine. I explained to her that there is a difference in the gestational age of her current son versus her last son by at least a week. If she is induced at this gestational age her son is at greater risk for developing respiratory issues, and that receiving Betamethasone now will help to promote lung maturity thus lessoning the chance of these issues. Mom agreed to receive 1 dose of Betamethasone, but requests that her husband not be told as he would be against her receiving this due to his beliefs. L&D aware of discussion  Sheppard Evens DNP, NNP

## 2019-06-11 NOTE — H&P (Signed)
H&P  Kelly Huber is an 44 y.o. female.  HPI: She presents to labor and delivery today from the office for elevated blood pressures. She reports that she has been having headaches this week and currently has a severe headache which is behind her eyes. She has some vision changes, describes blurry vision and floaters.   NINTH Problems (from 11/25/18 to present)    Problem Noted Resolved   Grand multiparity with antenatal problem, antepartum 02/12/2019 by Malachy Mood, MD No   Overview Signed 02/12/2019  5:14 PM by Malachy Mood, MD    Increased risk of uterine atony and postpartum hemorrhage      Hypertension affecting pregnancy, antepartum 02/12/2019 by Malachy Mood, MD No   Overview Addendum 05/18/2019  2:28 PM by Malachy Mood, MD    [X]  Aspirin 81 mg daily after 12 weeks; discontinue after 36 weeks [X]  baseline labs with CBC, CMP, urine protein/creatinine ratio [X]  no BP meds unless BPs become elevated - on labetalol 100mg  bid [ ]  ultrasound for growth at 27 week 962g 3lbs 2oz c/w 42/9%ile, 31 week 1765g 2lbs 14oz c/w 50.3%ile 36 weeks  Current antihypertensives:  Labetalol   Baseline and surveillance labs (pulled in from [facility], refresh links as needed)  Lab Results  Component Value Date   PLT 389 11/25/2018   CREATININE 0.88 12/20/2018   AST 54 (H) 12/17/2017   ALT 53 (H) 12/17/2017   PROTCRRATIO 0.29 (H) 02/28/2016   LABPROT 11.8 12/28/2016   PROTEIN24HR 110 12/20/2018    Antenatal Testing CHTN - O10.919  Group I  BP < 140/90, no preeclampsia, AGA,  nml AFV, +/- meds    Group II BP > 140/90, on meds, no preeclampsia, AGA, nml AFV  20-28-34-38  20-24-28-32-35-38  32//2 x wk  28//BPP wkly then 32//2 x wk  40 no meds; 39 meds  PRN or 37  Pre-eclampsia  GHTN - O13.9/Preeclampsia without severe features  - O14.00   Preeclampsia with severe features - O14.10  Q 3-4wks  Q 2 wks  28//BPP wkly then 32//2 x wk  Inpatient  37  PRN or 34          History of postpartum depression, currently pregnant 02/12/2019 by Malachy Mood, MD No   Supervision of high risk pregnancy, antepartum 11/25/2018 by Rexene Agent, CNM No   Overview Addendum 05/29/2019  9:30 AM by Dalia Heading, [place] Prenatal Labs  Dating 8wk [place] Blood type: O/Positive/-- (07/08 1051)   Genetic Screen  NIPS: diploid XY Antibody:Negative (07/08 1051)  Anatomic [place] Normal Rubella: 2.53 (07/08 1051) Varicella:  Immune  GTT Third trimester: 121 RPR: Non Reactive (07/08 1051)   Rhogam Not needed HBsAg: Negative (07/08 1051)   TDaP vaccine        Declines               Flu Shot: HIV: Non Reactive (07/08 1051)   Baby Food          Breast and bottle                      GBS:   Contraception  Pap: NIL 2020  CBB   Pelvis tested to 7lbs 2oz  CS/VBAC N/A Hgb AA  Support Person Ahmad             Hx of preeclampsia, prior pregnancy, currently pregnant 11/25/2018 by Rexene Agent, CNM No       Past Medical History:  Diagnosis  Date  . Advanced maternal age in multigravida   . Alcohol intoxication with delirium (HCC) 12/28/2016  . Chronic hypertension   . Gestational diabetes   . Gestational hypertension 02/29/2016  . History of postpartum depression   . Marijuana use     Past Surgical History:  Procedure Laterality Date  . cataract surgery    . dilation and currette      Family History  Problem Relation Age of Onset  . Hypertension Mother   . Bipolar disorder Father   . Schizophrenia Father   . Bipolar disorder Brother        bothers x2    Social History:  reports that she quit smoking about 3 years ago. Her smoking use included cigarettes. She has a 5.00 pack-year smoking history. She has never used smokeless tobacco. She reports current drug use. Drug: Marijuana. She reports that she does not drink alcohol.  Allergies:  Allergies  Allergen Reactions  . Codeine Hives and Shortness Of Breath  . Penicillins Hives and Shortness Of  Breath    Medications: I have reviewed the patient's current medications.  Results for orders placed or performed during the hospital encounter of 06/11/19 (from the past 48 hour(s))  ROM Plus ([facility] only)     Status: None   Collection Time: 06/11/19  3:56 PM  Result Value Ref Range   Rom Plus NEGATIVE     Comment: Performed at [facility], [address] [place], [place] [address]  Protein / creatinine ratio, urine     Status: Abnormal   Collection Time: 06/11/19  4:12 PM  Result Value Ref Range   Creatinine, Urine 24 mg/dL   Total Protein, Urine 16 mg/dL    Comment: NO NORMAL RANGE ESTABLISHED FOR THIS TEST   Protein Creatinine Ratio 0.67 (H) 0.00 - 0.15 mg/mg[Cre]    Comment: Performed at [facility], [address] [place], [place] [address]  CBC     Status: Abnormal   Collection Time: 06/11/19  4:23 PM  Result Value Ref Range   WBC 13.6 (H) 4.0 - 10.5 K/uL   RBC 3.76 (L) 3.87 - 5.11 MIL/uL   Hemoglobin 11.1 (L) 12.0 - 15.0 g/dL   HCT 85.9 (L) 29.2 - 44.6 %   MCV 87.2 80.0 - 100.0 fL   MCH 29.5 26.0 - 34.0 pg   MCHC 33.8 30.0 - 36.0 g/dL   RDW 28.6 38.1 - 77.1 %   Platelets 343 150 - 400 K/uL   nRBC 0.0 0.0 - 0.2 %    Comment: Performed at [facility], [address] [place], [place] [address]  Comprehensive metabolic panel     Status: Abnormal   Collection Time: 06/11/19  4:23 PM  Result Value Ref Range   Sodium 135 135 - 145 mmol/L   Potassium 3.7 3.5 - 5.1 mmol/L   Chloride 107 98 - 111 mmol/L   CO2 18 (L) 22 - 32 mmol/L   Glucose, Bld 81 70 - 99 mg/dL   BUN 10 6 - 20 mg/dL   Creatinine, Ser 0.38 0.44 - 1.00 mg/dL   Calcium 9.4 8.9 - 33.3 mg/dL   Total Protein 7.0 6.5 - 8.1 g/dL   Albumin 3.2 (L) 3.5 - 5.0 g/dL   AST 16 15 - 41 U/L   ALT 13 0 - 44 U/L   Alkaline Phosphatase 132 (H) 38 - 126 U/L   Total Bilirubin 0.6 0.3 - 1.2 mg/dL   GFR calc non  Af Amer >60 >60 mL/min   GFR calc Af Amer >60 >60 mL/min   Anion gap 10 5 - 15     Comment: Performed at [facility], [address] [place], [place] [address]    [place] [employer]  Result Date: 06/11/2019 Patient Name: Kelly Longanecker DOB: 1976/03/14 MRN: [id number] ULTRASOUND REPORT Location: [facility] Date of Service: 06/11/2019 Indications:AFI Findings: Aikens Jim intrauterine pregnancy is visualized with FHR at 146 BPM. Fetal presentation is Cephalic. Placenta: anterior. Grade: 3 AFI: 15.7 cm Impression: 1. [redacted]w[redacted]d Viable Singleton Intrauterine pregnancy dated by previously established criteria. 2. AFI is 15.7 cm. Recommendations: 1.Clinical correlation with the patient's History and Physical Exam. Deanna Artis, RT There is a singleton gestation with normal amniotic fluid volume. The visualized fetal anatomical survey appears within normal limits within the resolution of ultrasound as described above.  It must be noted that a normal ultrasound is unable to rule out fetal aneuploidy.  Vena Austria, MD, Evern Core [facility], [facility] 06/11/2019, 3:02 PM    Review of Systems  Constitutional: Negative for chills and fever.  HENT: Negative for congestion, hearing loss and sinus pain.   Respiratory: Negative for cough, shortness of breath and wheezing.   Cardiovascular: Negative for chest pain, palpitations and leg swelling.  Gastrointestinal: Negative for abdominal pain, constipation, diarrhea, nausea and vomiting.  Genitourinary: Negative for dysuria, flank pain, frequency, hematuria and urgency.  Musculoskeletal: Negative for back pain.  Skin: Negative for rash.  Neurological: Positive for headaches. Negative for dizziness.  Psychiatric/Behavioral: Negative for suicidal ideas. The patient is not nervous/anxious.    Blood pressure (!) 155/96, pulse 76, last menstrual period 09/11/2018, unknown if currently breastfeeding. Physical Exam  Nursing note and vitals reviewed. Constitutional: She is oriented to person, place, and time. She  appears well-developed and well-nourished.  HENT:  Head: Normocephalic and atraumatic.  Cardiovascular: Normal rate and regular rhythm.  Respiratory: Effort normal and breath sounds normal.  GI: Soft. Bowel sounds are normal.  Musculoskeletal:        General: Normal range of motion.  Neurological: She is alert and oriented to person, place, and time.  Skin: Skin is warm and dry.  Psychiatric: She has a normal mood and affect. Her behavior is normal. Judgment and thought content normal.   NST: 135 bpm baseline, moderate variability, 15x15 accelerations, no decelerations. Tocometer : irregular  Assessment/Plan: 44 yo [id number] [redacted]w[redacted]d 1. Preeclampsia with severe features of headaches, vision changes and severe range blood pressurses. Will admit and start on magnesium. IV medications PRN elevated BP. 2. GBS negative 3. IOL- start with cytotec.   4. Betamethasone- patient willing to accept one dose of betamethasone. She does not want a second dose because the FOB is going to be at the hospital and he does not believe in betamethasone. She does not want him to know that she had a does of betamethasone.  Jomarion Mish R Birt Reinoso 06/11/2019, 6:26 PM

## 2019-06-12 ENCOUNTER — Encounter: Payer: Self-pay | Admitting: Obstetrics and Gynecology

## 2019-06-12 ENCOUNTER — Inpatient Hospital Stay: Payer: Medicaid Other | Admitting: Anesthesiology

## 2019-06-12 DIAGNOSIS — O1092 Unspecified pre-existing hypertension complicating childbirth: Secondary | ICD-10-CM

## 2019-06-12 LAB — COMPREHENSIVE METABOLIC PANEL
ALT: 13 U/L (ref 0–44)
AST: 20 U/L (ref 15–41)
Albumin: 3.1 g/dL — ABNORMAL LOW (ref 3.5–5.0)
Alkaline Phosphatase: 133 U/L — ABNORMAL HIGH (ref 38–126)
Anion gap: 9 (ref 5–15)
BUN: 8 mg/dL (ref 6–20)
CO2: 19 mmol/L — ABNORMAL LOW (ref 22–32)
Calcium: 7.4 mg/dL — ABNORMAL LOW (ref 8.9–10.3)
Chloride: 102 mmol/L (ref 98–111)
Creatinine, Ser: 0.7 mg/dL (ref 0.44–1.00)
GFR calc Af Amer: >60 mL/min (ref >60)
GFR calc non Af Amer: >60 mL/min (ref >60)
Glucose, Bld: 128 mg/dL — ABNORMAL HIGH (ref 70–99)
Potassium: 3.9 mmol/L (ref 3.5–5.1)
Sodium: 130 mmol/L — ABNORMAL LOW (ref 135–145)
Total Bilirubin: 0.5 mg/dL (ref 0.3–1.2)
Total Protein: 7.1 g/dL (ref 6.5–8.1)

## 2019-06-12 LAB — CBC
HCT: 34 % — ABNORMAL LOW (ref 36.0–46.0)
Hemoglobin: 11.6 g/dL — ABNORMAL LOW (ref 12.0–15.0)
MCH: 29.8 pg (ref 26.0–34.0)
MCHC: 34.1 g/dL (ref 30.0–36.0)
MCV: 87.4 fL (ref 80.0–100.0)
Platelets: 299 10*3/uL (ref 150–400)
RBC: 3.89 MIL/uL (ref 3.87–5.11)
RDW: 14.6 % (ref 11.5–15.5)
WBC: 14.4 10*3/uL — ABNORMAL HIGH (ref 4.0–10.5)
nRBC: 0 % (ref 0.0–0.2)

## 2019-06-12 LAB — RPR: RPR Ser Ql: NONREACTIVE

## 2019-06-12 MED ORDER — FENTANYL 2.5 MCG/ML W/ROPIVACAINE 0.15% IN NS 100 ML EPIDURAL (ARMC)
EPIDURAL | Status: AC
  Filled 2019-06-12: qty 100

## 2019-06-12 MED ORDER — OXYCODONE-ACETAMINOPHEN 5-325 MG PO TABS
1.0000 | ORAL_TABLET | ORAL | Status: DC | PRN
  Administered 2019-06-13 – 2019-06-16 (×2): 1 via ORAL
  Filled 2019-06-12 (×2): qty 1

## 2019-06-12 MED ORDER — BENZOCAINE-MENTHOL 20-0.5 % EX AERO
INHALATION_SPRAY | CUTANEOUS | Status: AC
  Filled 2019-06-12: qty 56

## 2019-06-12 MED ORDER — SODIUM CHLORIDE 0.9 % IV SOLN
INTRAVENOUS | Status: DC | PRN
  Administered 2019-06-12 (×3): 5 mL via EPIDURAL

## 2019-06-12 MED ORDER — FENTANYL 2.5 MCG/ML W/ROPIVACAINE 0.15% IN NS 100 ML EPIDURAL (ARMC)
12.0000 mL/h | EPIDURAL | Status: DC
  Administered 2019-06-12: 12 mL/h via EPIDURAL

## 2019-06-12 MED ORDER — DIPHENHYDRAMINE HCL 25 MG PO CAPS: 25.0000 mg | ORAL_CAPSULE | Freq: Four times a day (QID) | ORAL | Status: DC | PRN

## 2019-06-12 MED ORDER — LACTATED RINGERS IV SOLN: 500.0000 mL | Freq: Once | INTRAVENOUS | Status: DC

## 2019-06-12 MED ORDER — ACETAMINOPHEN 325 MG PO TABS: 650.0000 mg | ORAL_TABLET | ORAL | Status: DC | PRN

## 2019-06-12 MED ORDER — PHENYLEPHRINE 40 MCG/ML (10ML) SYRINGE FOR IV PUSH (FOR BLOOD PRESSURE SUPPORT): 80.0000 ug | PREFILLED_SYRINGE | INTRAVENOUS | Status: DC | PRN

## 2019-06-12 MED ORDER — COCONUT OIL OIL
1.0000 "application " | TOPICAL_OIL | Status: DC | PRN
  Filled 2019-06-12: qty 120

## 2019-06-12 MED ORDER — PRENATAL MULTIVITAMIN CH
1.0000 | ORAL_TABLET | Freq: Every day | ORAL | Status: DC
  Administered 2019-06-13 – 2019-06-16 (×4): 1 via ORAL
  Filled 2019-06-12 (×4): qty 1

## 2019-06-12 MED ORDER — BENZOCAINE-MENTHOL 20-0.5 % EX AERO
1.0000 "application " | INHALATION_SPRAY | CUTANEOUS | Status: DC | PRN
  Administered 2019-06-12: 1 via TOPICAL

## 2019-06-12 MED ORDER — DIPHENHYDRAMINE HCL 50 MG/ML IJ SOLN: 12.5000 mg | INTRAMUSCULAR | Status: DC | PRN

## 2019-06-12 MED ORDER — LIDOCAINE HCL (PF) 1 % IJ SOLN
INTRAMUSCULAR | Status: DC | PRN
  Administered 2019-06-12: 2 mL

## 2019-06-12 MED ORDER — ONDANSETRON HCL 4 MG PO TABS: 4.0000 mg | ORAL_TABLET | ORAL | Status: DC | PRN

## 2019-06-12 MED ORDER — DIBUCAINE (PERIANAL) 1 % EX OINT
1.0000 "application " | TOPICAL_OINTMENT | CUTANEOUS | Status: DC | PRN
  Administered 2019-06-14: 1 via RECTAL
  Filled 2019-06-12 (×2): qty 28

## 2019-06-12 MED ORDER — OXYCODONE-ACETAMINOPHEN 5-325 MG PO TABS
2.0000 | ORAL_TABLET | ORAL | Status: DC | PRN
  Administered 2019-06-12 – 2019-06-15 (×13): 2 via ORAL
  Filled 2019-06-12 (×13): qty 2

## 2019-06-12 MED ORDER — IBUPROFEN 600 MG PO TABS
600.0000 mg | ORAL_TABLET | Freq: Four times a day (QID) | ORAL | Status: DC
  Administered 2019-06-12 – 2019-06-16 (×12): 600 mg via ORAL
  Filled 2019-06-12 (×13): qty 1

## 2019-06-12 MED ORDER — SENNOSIDES-DOCUSATE SODIUM 8.6-50 MG PO TABS
2.0000 | ORAL_TABLET | ORAL | Status: DC
  Administered 2019-06-12 – 2019-06-16 (×4): 2 via ORAL
  Filled 2019-06-12 (×4): qty 2

## 2019-06-12 MED ORDER — WITCH HAZEL-GLYCERIN EX PADS
1.0000 "application " | MEDICATED_PAD | CUTANEOUS | Status: DC | PRN
  Administered 2019-06-14: 1 via TOPICAL
  Filled 2019-06-12 (×2): qty 100

## 2019-06-12 MED ORDER — ONDANSETRON HCL 4 MG/2ML IJ SOLN: 4.0000 mg | INTRAMUSCULAR | Status: DC | PRN

## 2019-06-12 MED ORDER — SODIUM CHLORIDE (PF) 0.9 % IJ SOLN
INTRAMUSCULAR | Status: AC
  Filled 2019-06-12: qty 50

## 2019-06-12 MED ORDER — VANCOMYCIN HCL IN DEXTROSE 1-5 GM/200ML-% IV SOLN
1000.0000 mg | Freq: Two times a day (BID) | INTRAVENOUS | Status: DC
  Administered 2019-06-12: 1000 mg via INTRAVENOUS
  Filled 2019-06-12 (×3): qty 200

## 2019-06-12 MED ORDER — EPHEDRINE 5 MG/ML INJ: 10.0000 mg | INTRAVENOUS | Status: DC | PRN

## 2019-06-12 MED ORDER — SIMETHICONE 80 MG PO CHEW: 80.0000 mg | CHEWABLE_TABLET | ORAL | Status: DC | PRN

## 2019-06-12 NOTE — Discharge Summary (Signed)
Obstetric Discharge Summary Reason for Admission: induction of labor, superimposed preeclampsia with severe features Prenatal Procedures: non stress tests, ultrasounds Intrapartum Procedures: spontaneous vaginal delivery Postpartum Procedures: magnesium sulfate Complications-Operative and Postpartum: severe range blood pressures Hemoglobin  Date Value Ref Range Status  06/13/2019 10.5 (L) 12.0 - 15.0 g/dL Final  04/12/2019 9.5 (L) 11.1 - 15.9 g/dL Final   HCT  Date Value Ref Range Status  06/13/2019 30.7 (L) 36.0 - 46.0 % Final   Hematocrit  Date Value Ref Range Status  04/12/2019 28.1 (L) 34.0 - 46.6 % Final    Physical Exam:  General: alert, cooperative and no distress Lochia: appropriate Uterine Fundus: firm DVT Evaluation: No evidence of DVT seen on physical exam.  Discharge Diagnoses: Chronic hypertension, superimposed preeclampsia with severe features; Preterm delivery after induction of labor at 35wk5d.   Discharge Information: Date: 06/16/2019 Activity: pelvic rest, no strenuous activity x 4-6 weeks Diet: routine Allergies as of 06/16/2019      Reactions   Codeine Hives, Shortness Of Breath   Penicillins Hives, Shortness Of Breath      Medication List    STOP taking these medications   aspirin 81 MG chewable tablet   labetalol 100 MG tablet Commonly known as: NORMODYNE     TAKE these medications   cetirizine 10 MG tablet Commonly known as: ZYRTEC Take 10 mg by mouth daily.   ferrous sulfate 325 (65 FE) MG tablet Commonly known as: FerrouSul Take 1 tablet (325 mg total) by mouth daily with breakfast.   fluticasone 50 MCG/ACT nasal spray Commonly known as: FLONASE Place into both nostrils daily.   hydrocortisone 2.5 % rectal cream Commonly known as: Anusol-HC Place 1 application rectally 2 (two) times daily.   ibuprofen 600 MG tablet Commonly known as: ADVIL Take 1 tablet (600 mg total) by mouth every 6 (six) hours.   nicotine 14 mg/24hr  patch Commonly known as: NICODERM CQ - dosed in mg/24 hours Place 14 mg onto the skin daily.   NIFEdipine 90 MG 24 hr tablet Commonly known as: PROCARDIA XL/NIFEDICAL-XL Take 1 tablet (90 mg total) by mouth daily. Start taking on: June 17, 2019   norethindrone 0.35 MG tablet Commonly known as: MICRONOR Take 1 tablet (0.35 mg total) by mouth daily. Start taking on: July 11, 2019   omeprazole 20 MG capsule Commonly known as: PRILOSEC Take 1 capsule (20 mg total) by mouth daily.   oxyCODONE-acetaminophen 5-325 MG tablet Commonly known as: PERCOCET/ROXICET Take 1 tablet by mouth every 6 (six) hours as needed for up to 5 days for severe pain (>7).   prenatal vitamin w/FE, FA 27-1 MG Tabs tablet Prenatal Plus (calcium carbonate) 27 mg iron-1 mg tablet  TAKE 1 TABLET BY MOUTH DAILY WHILE TRYING TO CONCEIVE, PREGNANT & BREASFEEDING   senna-docusate 8.6-50 MG tablet Commonly known as: Senokot-S Take 2 tablets by mouth daily. Start taking on: June 17, 2019   sertraline 50 MG tablet Commonly known as: ZOLOFT Take 0.5 tablet daily x 7 days then increase to 1 tablet daily   triamcinolone cream 0.5 % Commonly known as: KENALOG Apply 1 application topically 3 (three) times daily as needed.       Condition: stable Discharge to: home Follow-up Information    Malachy Mood, MD. Go on 06/18/2019.   Specialty: Obstetrics and Gynecology Why: please go to follow up appointment for BP check on friday 1/29 at 1:30PM Contact information: [address] [place] [address] [phone]  Newborn Data: Live born child Ahmilyin Birth Weight:  5#1.5oz APGAR: 8, 9  Newborn Delivery   Birth date/time: 06/12/2019 20:36:00 Delivery type: Vaginal, Spontaneous      Home with mother.  Farrel Conners 06/16/2019, 4:19 PM

## 2019-06-12 NOTE — Anesthesia Procedure Notes (Signed)
Epidural Patient location during procedure: OB Start time: 06/12/2019 6:31 PM End time: 06/12/2019 6:50 PM  Staffing Anesthesiologist: Karleen Hampshire, MD Performed: anesthesiologist   Preanesthetic Checklist Completed: patient identified, IV checked, site marked, risks and benefits discussed, surgical consent, monitors and equipment checked, pre-op evaluation and timeout performed  Epidural Patient position: sitting Prep: ChloraPrep Patient monitoring: heart rate, continuous pulse ox and blood pressure Approach: midline Location: L4-L5 Injection technique: LOR saline  Needle:  Needle type: Tuohy  Needle gauge: 18 G Needle length: 9 cm and 9 Needle insertion depth: 6 cm Catheter type: closed end flexible Catheter size: 20 Guage Catheter at skin depth: 10 cm Test dose: negative and Other  Assessment Events: blood not aspirated, injection not painful, no injection resistance, no paresthesia and negative IV test  Additional Notes Discussed risks and benefits in detail.  Patient expressed understanding and consented to epidural placement.  Negative dural puncture.  Negative aspiration.  Negative paresthesia on injection.  Dose given in divided aliquots.  Patient tolerated the insertion well without complications.Reason for block:procedure for pain

## 2019-06-12 NOTE — Anesthesia Preprocedure Evaluation (Signed)
Anesthesia Evaluation  Patient identified by MRN, date of birth, ID band Patient awake    Reviewed: Allergy & Precautions, H&P , NPO status , Patient's Chart, lab work & pertinent test results  Airway Mallampati: II  TM Distance: >3 FB Neck ROM: full    Dental  (+) Chipped, Poor Dentition   Pulmonary neg pulmonary ROS, former smoker,           Cardiovascular Exercise Tolerance: Good hypertension (chronic HTN with superimposed pre-ecclampsia),   Pre-ecclampsia with severe features (headache and vision changes), on magnesium   Neuro/Psych PSYCHIATRIC DISORDERS    GI/Hepatic negative GI ROS,   Endo/Other    Renal/GU   negative genitourinary   Musculoskeletal   Abdominal   Peds  Hematology  (+) Blood dyscrasia, anemia ,   Anesthesia Other Findings Past Medical History: No date: Advanced maternal age in multigravida 12/28/2016: Alcohol intoxication with delirium (HCC) No date: Chronic hypertension No date: Gestational diabetes 02/29/2016: Gestational hypertension No date: History of postpartum depression No date: Marijuana use  Past Surgical History: No date: cataract surgery No date: dilation and currette  BMI    Body Mass Index: 33.83 kg/m      Reproductive/Obstetrics (+) Pregnancy                             Anesthesia Physical Anesthesia Plan  ASA: III  Anesthesia Plan: Epidural   Post-op Pain Management:    Induction:   PONV Risk Score and Plan:   Airway Management Planned:   Additional Equipment:   Intra-op Plan:   Post-operative Plan:   Informed Consent: I have reviewed the patients History and Physical, chart, labs and discussed the procedure including the risks, benefits and alternatives for the proposed anesthesia with the patient or authorized representative who has indicated his/her understanding and acceptance.       Plan Discussed with:  Anesthesiologist  Anesthesia Plan Comments:         Anesthesia Quick Evaluation

## 2019-06-12 NOTE — Progress Notes (Signed)
   Subjective:  Frustrated and tearful about lack of progress.  Remains 3cm on most recent check by nursing.  Tired and worn out.  Objective:   Vitals: Blood pressure (!) 152/91, pulse 79, temperature 97.8 F (36.6 C), temperature source Oral, resp. rate 18, height 5\' 3"  (1.6 m), weight 86.6 kg, last menstrual period 09/11/2018, SpO2 95 %, unknown if currently breastfeeding. General: NAD Abdomen:soft, non-tender Cervical Exam:  Dilation: 3 Effacement (%): 70 Cervical Position: Posterior, Middle Station: -2 Presentation: Vertex Exam by:: [email or web address] RN  FHT: 120, moderate, +accels, no decels Toco: q1-52min  Results for orders placed or performed during the hospital encounter of 06/11/19 (from the past 24 hour(s))  Respiratory Panel by RT PCR (Flu A&B, Covid) - Nasopharyngeal Swab     Status: None   Collection Time: 06/11/19  7:33 PM   Specimen: Nasopharyngeal Swab  Result Value Ref Range   SARS Coronavirus 2 by RT PCR NEGATIVE NEGATIVE   Influenza A by PCR NEGATIVE NEGATIVE   Influenza B by PCR NEGATIVE NEGATIVE  Type and screen [facility]     Status: None   Collection Time: 06/11/19 10:51 PM  Result Value Ref Range   ABO/RH(D) O POS    Antibody Screen NEG    Sample Expiration      06/14/2019,2359 Performed at [facility], [address] [place], [place]   RPR     Status: None   Collection Time: 06/11/19 10:51 PM  Result Value Ref Range   RPR Ser Ql NON REACTIVE NON REACTIVE    Assessment:   44 y.o. 46 [redacted]w[redacted]d IOL superimposed preeclampsia with severe features based on neurologic criteria  Plan:   1) Labor - continue to titrate up on pitocin.  Patient expressing frustration as to length of induction and we discussed that these may take up to 72-hrs and at present we have not gotten her body into active labor on its own.  Will have patient discuss epidural with anesthesia  2) Fetus - cat I tracing - vancomycin for GBS  ppx  3) Superimposed preeclampsia  - continue magnesium sulfate - BP normotensive to mild range  [redacted]w[redacted]d, MD, Vena Austria OB/GYN, [facility] 06/12/2019, 5:33 PM

## 2019-06-12 NOTE — Progress Notes (Signed)
Subjective:  Comfortable with stadol.  Feeling better than yesterday.  No HA currently  Objective:   Vitals: Blood pressure 139/79, pulse 82, temperature 98 F (36.7 C), temperature source Oral, resp. rate 18, height 5\' 3"  (1.6 m), weight 86.6 kg, last menstrual period 09/11/2018, SpO2 97 %, unknown if currently breastfeeding. General: NAD Abdomen: Gravid, non-tender Cervical Exam:  Dilation: 2.5 Effacement (%): 70 Cervical Position: Posterior, Middle Station: -3, -2 Presentation: Vertex Exam by:: Caedan Sumler  FHT: 130, moderate, +accels, no decels Toco: q63min  Results for orders placed or performed during the hospital encounter of 06/11/19 (from the past 24 hour(s))  ROM Plus ([facility] only)     Status: None   Collection Time: 06/11/19  3:56 PM  Result Value Ref Range   Rom Plus NEGATIVE   Protein / creatinine ratio, urine     Status: Abnormal   Collection Time: 06/11/19  4:12 PM  Result Value Ref Range   Creatinine, Urine 24 mg/dL   Total Protein, Urine 16 mg/dL   Protein Creatinine Ratio 0.67 (H) 0.00 - 0.15 mg/mg[Cre]  CBC     Status: Abnormal   Collection Time: 06/11/19  4:23 PM  Result Value Ref Range   WBC 13.6 (H) 4.0 - 10.5 K/uL   RBC 3.76 (L) 3.87 - 5.11 MIL/uL   Hemoglobin 11.1 (L) 12.0 - 15.0 g/dL   HCT 06/13/19 (L) 23.3 - 61.2 %   MCV 87.2 80.0 - 100.0 fL   MCH 29.5 26.0 - 34.0 pg   MCHC 33.8 30.0 - 36.0 g/dL   RDW 24.4 97.5 - 30.0 %   Platelets 343 150 - 400 K/uL   nRBC 0.0 0.0 - 0.2 %  Comprehensive metabolic panel     Status: Abnormal   Collection Time: 06/11/19  4:23 PM  Result Value Ref Range   Sodium 135 135 - 145 mmol/L   Potassium 3.7 3.5 - 5.1 mmol/L   Chloride 107 98 - 111 mmol/L   CO2 18 (L) 22 - 32 mmol/L   Glucose, Bld 81 70 - 99 mg/dL   BUN 10 6 - 20 mg/dL   Creatinine, Ser 06/13/19 0.44 - 1.00 mg/dL   Calcium 9.4 8.9 - 0.21 mg/dL   Total Protein 7.0 6.5 - 8.1 g/dL   Albumin 3.2 (L) 3.5 - 5.0 g/dL   AST 16 15 - 41 U/L   ALT 13 0 - 44 U/L   Alkaline Phosphatase 132 (H) 38 - 126 U/L   Total Bilirubin 0.6 0.3 - 1.2 mg/dL   GFR calc non Af Amer >60 >60 mL/min   GFR calc Af Amer >60 >60 mL/min   Anion gap 10 5 - 15  Rapid HIV screen (HIV 1/2 Ab+Ag) ([facility] Only)     Status: None   Collection Time: 06/11/19  4:23 PM  Result Value Ref Range   HIV-1 P24 Antigen - HIV24 NON REACTIVE NON REACTIVE   HIV 1/2 Antibodies NON REACTIVE NON REACTIVE   Interpretation (HIV Ag Ab)      A non reactive test result means that HIV 1 or HIV 2 antibodies and HIV 1 p24 antigen were not detected in the specimen.  Respiratory Panel by RT PCR (Flu A&B, Covid) - Nasopharyngeal Swab     Status: None   Collection Time: 06/11/19  7:33 PM   Specimen: Nasopharyngeal Swab  Result Value Ref Range   SARS Coronavirus 2 by RT PCR NEGATIVE NEGATIVE   Influenza A by PCR NEGATIVE NEGATIVE  Influenza B by PCR NEGATIVE NEGATIVE  Type and screen [facility]     Status: None   Collection Time: 06/11/19 10:51 PM  Result Value Ref Range   ABO/RH(D) O POS    Antibody Screen NEG    Sample Expiration      06/14/2019,2359 Performed at [facility], [address] [place], [place] [address]     Assessment:   44 y.o. [id number] [redacted]w[redacted]d   NINTH Problems (from 11/25/18 to present)    Problem Noted Resolved   Grand multiparity with antenatal problem, antepartum 02/12/2019 by Vena Austria, MD No   Overview Signed 02/12/2019  5:14 PM by Vena Austria, MD    Increased risk of uterine atony and postpartum hemorrhage      Hypertension affecting pregnancy, antepartum 02/12/2019 by Vena Austria, MD No   Overview Addendum 05/18/2019  2:28 PM by Vena Austria, MD    [X]  Aspirin 81 mg daily after 12 weeks; discontinue after 36 weeks [X]  baseline labs with CBC, CMP, urine protein/creatinine ratio [X]  no BP meds unless BPs become elevated - on labetalol 100mg  bid [ ]  ultrasound for growth at 27 week 962g 3lbs 2oz c/w 42/9%ile, 31 week  1765g 2lbs 14oz c/w 50.3%ile 36 weeks  Current antihypertensives:  Labetalol   Baseline and surveillance labs (pulled in from [facility], refresh links as needed)  Lab Results  Component Value Date   PLT 389 11/25/2018   CREATININE 0.88 12/20/2018   AST 54 (H) 12/17/2017   ALT 53 (H) 12/17/2017   PROTCRRATIO 0.29 (H) 02/28/2016   LABPROT 11.8 12/28/2016   PROTEIN24HR 110 12/20/2018    Antenatal Testing CHTN - O10.919  Group I  BP < 140/90, no preeclampsia, AGA,  nml AFV, +/- meds    Group II BP > 140/90, on meds, no preeclampsia, AGA, nml AFV  20-28-34-38  20-24-28-32-35-38  32//2 x wk  28//BPP wkly then 32//2 x wk  40 no meds; 39 meds  PRN or 37  Pre-eclampsia  GHTN - O13.9/Preeclampsia without severe features  - O14.00   Preeclampsia with severe features - O14.10  Q 3-4wks  Q 2 wks  28//BPP wkly then 32//2 x wk  Inpatient  37  PRN or 34         History of postpartum depression, currently pregnant 02/12/2019 by 02/19/2019, MD No   Supervision of high risk pregnancy, antepartum 11/25/2018 by 02-18-1969, CNM No   Overview Addendum 05/29/2019  9:30 AM by 02/14/2019, CNM    Cl[facility] Prenatal Labs  Dating 8wk Vena Austria Blood type: O/Positive/-- (07/08 1051)   Genetic Screen  NIPS: diploid XY Antibody:Negative (07/08 1051)  Anatomic 07/27/2019 Normal Rubella: 2.53 (07/08 1051) Varicella:  Immune  GTT Third trimester: 121 RPR: Non Reactive (07/08 1051)   Rhogam Not needed HBsAg: Negative (07/08 1051)   TDaP vaccine  Declines   Flu Shot: HIV: Non Reactive (07/08 1051)   Baby Food Breast and bottle                      GBS: unknown  Contraception  Pap: NIL 2020  CBB   Pelvis tested to 7lbs 2oz  CS/VBAC N/A Hgb AA  Support Person Ahmad             Hx of preeclampsia, prior pregnancy, currently pregnant 11/25/2018 by 12-13-1980, CNM No       Plan:   1) Labor -continue pitocin  2) Fetus -  cat I tracing  3) Superimposed preeclampsia wit severe  features - continue magnesium sulfate - other than P/C ratio labs normal on admission - BP currently normotensive to mild range  4) GBS unknown - PCR negative 05/24/2019 In the setting of expected delivery prior to 37 weeks GBS prophylaxis is indicated in the setting of unknown GBS status.  ("Prevention of Early Onset Group-B Streptococcal Disease in New Borns"  ACOG Committee Opinion 782 November 11, 2017 and replacing 2010 CDC "Guidlines for the Prevention of Perinatal Group B Streptococcal Disease"). Negative PCR results are still prophylaxed on the current GBS guidelines.     Malachy Mood, MD, [facility], [facility] 06/12/2019, 11:04 AM

## 2019-06-12 NOTE — Progress Notes (Signed)
Subjective:  Feeling stronger contractions.    Objective:   Vitals: Blood pressure 134/84, pulse 76, temperature 97.8 F (36.6 C), temperature source Oral, resp. rate 20, height 5\' 3"  (1.6 m), weight 86.6 kg, last menstrual period 09/11/2018, SpO2 95 %, unknown if currently breastfeeding. General: NAD Abdomen: gravid, non-tneder Cervical Exam:  Dilation: 3 Effacement (%): 70 Cervical Position: Posterior, Middle Station: -2 Presentation: Vertex Exam by:: Leani Myron AROM clear  FHT: 120, moderate, no accels, no decels Toco: q2-57min  Results for orders placed or performed during the hospital encounter of 06/11/19 (from the past 24 hour(s))  ROM Plus ([facility] only)     Status: None   Collection Time: 06/11/19  3:56 PM  Result Value Ref Range   Rom Plus NEGATIVE   Protein / creatinine ratio, urine     Status: Abnormal   Collection Time: 06/11/19  4:12 PM  Result Value Ref Range   Creatinine, Urine 24 mg/dL   Total Protein, Urine 16 mg/dL   Protein Creatinine Ratio 0.67 (H) 0.00 - 0.15 mg/mg[Cre]  CBC     Status: Abnormal   Collection Time: 06/11/19  4:23 PM  Result Value Ref Range   WBC 13.6 (H) 4.0 - 10.5 K/uL   RBC 3.76 (L) 3.87 - 5.11 MIL/uL   Hemoglobin 11.1 (L) 12.0 - 15.0 g/dL   HCT 06/13/19 (L) 16.1 - 09.6 %   MCV 87.2 80.0 - 100.0 fL   MCH 29.5 26.0 - 34.0 pg   MCHC 33.8 30.0 - 36.0 g/dL   RDW 04.5 40.9 - 81.1 %   Platelets 343 150 - 400 K/uL   nRBC 0.0 0.0 - 0.2 %  Comprehensive metabolic panel     Status: Abnormal   Collection Time: 06/11/19  4:23 PM  Result Value Ref Range   Sodium 135 135 - 145 mmol/L   Potassium 3.7 3.5 - 5.1 mmol/L   Chloride 107 98 - 111 mmol/L   CO2 18 (L) 22 - 32 mmol/L   Glucose, Bld 81 70 - 99 mg/dL   BUN 10 6 - 20 mg/dL   Creatinine, Ser 06/13/19 0.44 - 1.00 mg/dL   Calcium 9.4 8.9 - 7.82 mg/dL   Total Protein 7.0 6.5 - 8.1 g/dL   Albumin 3.2 (L) 3.5 - 5.0 g/dL   AST 16 15 - 41 U/L   ALT 13 0 - 44 U/L   Alkaline Phosphatase 132 (H) 38  - 126 U/L   Total Bilirubin 0.6 0.3 - 1.2 mg/dL   GFR calc non Af Amer >60 >60 mL/min   GFR calc Af Amer >60 >60 mL/min   Anion gap 10 5 - 15  Rapid HIV screen (HIV 1/2 Ab+Ag) ([facility] Only)     Status: None   Collection Time: 06/11/19  4:23 PM  Result Value Ref Range   HIV-1 P24 Antigen - HIV24 NON REACTIVE NON REACTIVE   HIV 1/2 Antibodies NON REACTIVE NON REACTIVE   Interpretation (HIV Ag Ab)      A non reactive test result means that HIV 1 or HIV 2 antibodies and HIV 1 p24 antigen were not detected in the specimen.  Respiratory Panel by RT PCR (Flu A&B, Covid) - Nasopharyngeal Swab     Status: None   Collection Time: 06/11/19  7:33 PM   Specimen: Nasopharyngeal Swab  Result Value Ref Range   SARS Coronavirus 2 by RT PCR NEGATIVE NEGATIVE   Influenza A by PCR NEGATIVE NEGATIVE   Influenza B by PCR  NEGATIVE NEGATIVE  Type and screen [facility]     Status: None   Collection Time: 06/11/19 10:51 PM  Result Value Ref Range   ABO/RH(D) O POS    Antibody Screen NEG    Sample Expiration      06/14/2019,2359 Performed at [facility], [address] [place], [place] [address]   RPR     Status: None   Collection Time: 06/11/19 10:51 PM  Result Value Ref Range   RPR Ser Ql NON REACTIVE NON REACTIVE    Assessment:   44 y.o. [id number] [redacted]w[redacted]d IOL superimposed preeclampsia with severe features based on neurologic criteria  Plan:   1) Labor - continue pitocin, AROM clear  2) Fetus - cat I tracing  3) Superimposed preeclampsia - continue magnesium sulfate - BP normotensive to mild range  Malachy Mood, MD, [place], [facility] 06/12/2019, 3:22 PM

## 2019-06-13 ENCOUNTER — Encounter: Payer: Self-pay | Admitting: Obstetrics and Gynecology

## 2019-06-13 LAB — CBC
HCT: 30.7 % — ABNORMAL LOW (ref 36.0–46.0)
Hemoglobin: 10.5 g/dL — ABNORMAL LOW (ref 12.0–15.0)
MCH: 30 pg (ref 26.0–34.0)
MCHC: 34.2 g/dL (ref 30.0–36.0)
MCV: 87.7 fL (ref 80.0–100.0)
Platelets: 292 10*3/uL (ref 150–400)
RBC: 3.5 MIL/uL — ABNORMAL LOW (ref 3.87–5.11)
RDW: 14.6 % (ref 11.5–15.5)
WBC: 17.6 10*3/uL — ABNORMAL HIGH (ref 4.0–10.5)
nRBC: 0 % (ref 0.0–0.2)

## 2019-06-13 MED ORDER — LABETALOL HCL 200 MG PO TABS
200.0000 mg | ORAL_TABLET | Freq: Two times a day (BID) | ORAL | Status: DC
  Administered 2019-06-13 (×2): 200 mg via ORAL
  Filled 2019-06-13: qty 1
  Filled 2019-06-13: qty 2
  Filled 2019-06-13: qty 1

## 2019-06-13 MED ORDER — ZOLPIDEM TARTRATE 5 MG PO TABS
5.0000 mg | ORAL_TABLET | Freq: Every evening | ORAL | Status: DC | PRN
  Administered 2019-06-14: 5 mg via ORAL
  Filled 2019-06-13 (×2): qty 1

## 2019-06-13 MED ORDER — LACTATED RINGERS IV SOLN: INTRAVENOUS | Status: DC

## 2019-06-13 NOTE — Lactation Note (Signed)
This note was copied from a baby's chart. Lactation Consultation Note  Patient Name: Kelly Huber [id number] Date: 06/13/2019   Observed mom breast feeding twice today. Mom is latching Kelly Huber without assistance even after she has been giving bottles of formula after breast feeding.  Reviewed supply and demand, normal newborn stomach size and encouraged mom to put Kelly Huber to the breast whenever he demonstrated feeding cues rather than giving so many bottles of formula explaining potential risks to mom's milk supply.  Mom did admit that [name] spit after last feeding.  Encouraged mom to at least cut back on volume of formula that she is feeding him after every breast feed.  Demonstrated hand expression, but only got a couple of drops after several attempts.  Mom requesting breast pump kit reporting she plans to get DEBP through [facility] later.  Mom reports breast feeding 76 year old for 6 months before milk started to dry up.  Reviewed normal course of lactation and routine newborn feeding patterns.  Maternal Data    Feeding Feeding Type: Breast Fed Nipple Type: Slow - flow  LATCH Score                   Interventions    Lactation Tools Discussed/Used     Consult Status      [name] 06/13/2019, 11:25 PM

## 2019-06-13 NOTE — Progress Notes (Signed)
Subjective:  No concerns, mild headache.  Minimal lochia.  Objective:  Vital signs in last 24 hours: Temp:  [97.6 F (36.4 C)-98 F (36.7 C)] 98 F (36.7 C) (01/24 0731) Pulse Rate:  [66-96] 74 (01/24 0930) Resp:  [16-20] 16 (01/24 0930) BP: (118-153)/(74-99) 147/79 (01/24 0930) SpO2:  [91 %-100 %] 99 % (01/24 0945)    General: NAD Pulmonary: no increased work of breathing Abdomen: non-distended, non-tender, fundus firm at level of umbilicus Extremities: no edema, no erythema, no tenderness  Results for orders placed or performed during the hospital encounter of 06/11/19 (from the past 72 hour(s))  ROM Plus ([place] only)     Status: None   Collection Time: 06/11/19  3:56 PM  Result Value Ref Range   Rom Plus NEGATIVE     Comment: Performed at [facility], [address] [place], [place] [address]  Protein / creatinine ratio, urine     Status: Abnormal   Collection Time: 06/11/19  4:12 PM  Result Value Ref Range   Creatinine, Urine 24 mg/dL   Total Protein, Urine 16 mg/dL    Comment: NO NORMAL RANGE ESTABLISHED FOR THIS TEST   Protein Creatinine Ratio 0.67 (H) 0.00 - 0.15 mg/mg[Cre]    Comment: Performed at [facility], [address] [place], [place] [address]  CBC     Status: Abnormal   Collection Time: 06/11/19  4:23 PM  Result Value Ref Range   WBC 13.6 (H) 4.0 - 10.5 K/uL   RBC 3.76 (L) 3.87 - 5.11 MIL/uL   Hemoglobin 11.1 (L) 12.0 - 15.0 g/dL   HCT 32.8 (L) 36.0 - 46.0 %   MCV 87.2 80.0 - 100.0 fL   MCH 29.5 26.0 - 34.0 pg   MCHC 33.8 30.0 - 36.0 g/dL   RDW 14.6 11.5 - 15.5 %   Platelets 343 150 - 400 K/uL   nRBC 0.0 0.0 - 0.2 %    Comment: Performed at [facility], [address] [place], [place] [address]  Comprehensive metabolic panel     Status: Abnormal   Collection Time: 06/11/19  4:23 PM  Result Value Ref Range   Sodium 135 135 - 145 mmol/L   Potassium 3.7 3.5 - 5.1 mmol/L   Chloride 107 98 - 111 mmol/L   CO2 18  (L) 22 - 32 mmol/L   Glucose, Bld 81 70 - 99 mg/dL   BUN 10 6 - 20 mg/dL   Creatinine, Ser 0.74 0.44 - 1.00 mg/dL   Calcium 9.4 8.9 - 10.3 mg/dL   Total Protein 7.0 6.5 - 8.1 g/dL   Albumin 3.2 (L) 3.5 - 5.0 g/dL   AST 16 15 - 41 U/L   ALT 13 0 - 44 U/L   Alkaline Phosphatase 132 (H) 38 - 126 U/L   Total Bilirubin 0.6 0.3 - 1.2 mg/dL   GFR calc non Af Amer >60 >60 mL/min   GFR calc Af Amer >60 >60 mL/min   Anion gap 10 5 - 15    Comment: Performed at [facility], [address] [place], [place] [address]  Rapid HIV screen (HIV 1/2 Ab+Ag) ([facility] Only)     Status: None   Collection Time: 06/11/19  4:23 PM  Result Value Ref Range   HIV-1 P24 Antigen - HIV24 NON REACTIVE NON REACTIVE    Comment: (NOTE) Detection of p24 may be inhibited by biotin in the sample, causing false negative results in acute infection.  HIV 1/2 Antibodies NON REACTIVE NON REACTIVE   Interpretation (HIV Ag Ab)      A non reactive test result means that HIV 1 or HIV 2 antibodies and HIV 1 p24 antigen were not detected in the specimen.    Comment: Performed at [facility], [address] [place], [place] [address]  Respiratory Panel by RT PCR (Flu A&B, Covid) - Nasopharyngeal Swab     Status: None   Collection Time: 06/11/19  7:33 PM   Specimen: Nasopharyngeal Swab  Result Value Ref Range   SARS Coronavirus 2 by RT PCR NEGATIVE NEGATIVE    Comment: (NOTE) SARS-CoV-2 target nucleic acids are NOT DETECTED. The SARS-CoV-2 RNA is generally detectable in upper respiratoy specimens during the acute phase of infection. The lowest concentration of SARS-CoV-2 viral copies this assay can detect is 131 copies/mL. A negative result does not preclude SARS-Cov-2 infection and should not be used as the sole basis for treatment or other patient management decisions. A negative result may occur with  improper specimen collection/handling, submission of specimen other than nasopharyngeal swab,  presence of viral mutation(s) within the areas targeted by this assay, and inadequate number of viral copies (<131 copies/mL). A negative result must be combined with clinical observations, patient history, and epidemiological information. The expected result is Negative. Fact Sheet for Patients:  https://www.moore.com/ Fact Sheet for Healthcare Providers:  https://www.young.biz/ This test is not yet ap proved or cleared by the [place] FDA and  has been authorized for detection and/or diagnosis of SARS-CoV-2 by FDA under an Emergency Use Authorization (EUA). This EUA will remain  in effect (meaning this test can be used) for the duration of the COVID-19 declaration under Section 564(b)(1) of the Act, 21 U.S.C. section 360bbb-3(b)(1), unless the authorization is terminated or revoked sooner.    Influenza A by PCR NEGATIVE NEGATIVE   Influenza B by PCR NEGATIVE NEGATIVE    Comment: (NOTE) The Xpert Xpress SARS-CoV-2/FLU/RSV assay is intended as an aid in  the diagnosis of influenza from Nasopharyngeal swab specimens and  should not be used as a sole basis for treatment. Nasal washings and  aspirates are unacceptable for Xpert Xpress SARS-CoV-2/FLU/RSV  testing. Fact Sheet for Patients: https://www.moore.com/ Fact Sheet for Healthcare Providers: https://www.young.biz/ This test is not yet approved or cleared by the [place] FDA and  has been authorized for detection and/or diagnosis of SARS-CoV-2 by  FDA under an Emergency Use Authorization (EUA). This EUA will remain  in effect (meaning this test can be used) for the duration of the  Covid-19 declaration under Section 564(b)(1) of the Act, 21  U.S.C. section 360bbb-3(b)(1), unless the authorization is  terminated or revoked. Performed at [facility], [address] [place], [place] [address]   Type and screen [facility]     Status: None   Collection Time: 06/11/19 10:51 PM  Result Value Ref Range   ABO/RH(D) O POS    Antibody Screen NEG    Sample Expiration      06/14/2019,2359 Performed at [facility], [address] [place], [place] [address]   RPR     Status: None   Collection Time: 06/11/19 10:51 PM  Result Value Ref Range   RPR Ser Ql NON REACTIVE NON REACTIVE    Comment: Performed at [facility], [address] [place], [place] [address]  CBC     Status: Abnormal   Collection Time: 06/12/19  5:56 PM  Result  Value Ref Range   WBC 14.4 (H) 4.0 - 10.5 K/uL   RBC 3.89 3.87 - 5.11 MIL/uL   Hemoglobin 11.6 (L) 12.0 - 15.0 g/dL   HCT 00.7 (L) 62.2 - 63.3 %   MCV 87.4 80.0 - 100.0 fL   MCH 29.8 26.0 - 34.0 pg   MCHC 34.1 30.0 - 36.0 g/dL   RDW 35.4 56.2 - 56.3 %   Platelets 299 150 - 400 K/uL   nRBC 0.0 0.0 - 0.2 %    Comment: Performed at [facility], [address] [place], [place] [address]  Comprehensive metabolic panel     Status: Abnormal   Collection Time: 06/12/19  5:56 PM  Result Value Ref Range   Sodium 130 (L) 135 - 145 mmol/L   Potassium 3.9 3.5 - 5.1 mmol/L   Chloride 102 98 - 111 mmol/L   CO2 19 (L) 22 - 32 mmol/L   Glucose, Bld 128 (H) 70 - 99 mg/dL   BUN 8 6 - 20 mg/dL   Creatinine, Ser 4.28 0.44 - 1.00 mg/dL   Calcium 7.4 (L) 8.9 - 10.3 mg/dL   Total Protein 7.1 6.5 - 8.1 g/dL   Albumin 3.1 (L) 3.5 - 5.0 g/dL   AST 20 15 - 41 U/L   ALT 13 0 - 44 U/L   Alkaline Phosphatase 133 (H) 38 - 126 U/L   Total Bilirubin 0.5 0.3 - 1.2 mg/dL   GFR calc non Af Amer >60 >60 mL/min   GFR calc Af Amer >60 >60 mL/min   Anion gap 9 5 - 15    Comment: Performed at [facility], [address] [place], [place] [address]  CBC     Status: Abnormal   Collection Time: 06/13/19  6:03 AM  Result Value Ref Range   WBC 17.6 (H) 4.0 - 10.5 K/uL   RBC 3.50 (L) 3.87 - 5.11 MIL/uL   Hemoglobin 10.5 (L) 12.0 - 15.0 g/dL   HCT 57.2 (L)  62.0 - 46.0 %   MCV 87.7 80.0 - 100.0 fL   MCH 30.0 26.0 - 34.0 pg   MCHC 34.2 30.0 - 36.0 g/dL   RDW 35.5 97.4 - 16.3 %   Platelets 292 150 - 400 K/uL   nRBC 0.0 0.0 - 0.2 %    Comment: Performed at [facility], [address] [place], [place] [address]    Assessment:   44 y.o. [id number] postpartum day # 1 TSVD, superimposed preeclampsia with severe features  Plan:    1) Acute blood loss anemia - hemodynamically stable and asymptomatic - po ferrous sulfate  2) Blood Type --/--/O POS (01/22 2251) / Rubella 2.53 (07/08 1051) / Varicella Immune  3) TDAP status declines  4) Feeding plan breast & bottle  5)  Education given regarding options for contraception, as well as compatibility with breast feeding if applicable.  Patient plans on oral progesterone-only contraceptive for contraception. - slynd  6) Superimposed preeclampsia - labetalol 200mg  po bid - finish out 24-hr course of magnesium sulfate  7) Disposition pending delivery  , MD, [facility], [facility] 06/13/2019, 10:04 AM

## 2019-06-14 MED ORDER — LABETALOL HCL 200 MG PO TABS
300.0000 mg | ORAL_TABLET | Freq: Two times a day (BID) | ORAL | Status: DC
  Administered 2019-06-14: 300 mg via ORAL
  Filled 2019-06-14: qty 1

## 2019-06-14 MED ORDER — LABETALOL HCL 100 MG PO TABS
300.0000 mg | ORAL_TABLET | Freq: Three times a day (TID) | ORAL | Status: DC
  Administered 2019-06-14: 300 mg via ORAL
  Filled 2019-06-14: qty 3

## 2019-06-14 NOTE — Lactation Note (Signed)
This note was copied from a baby's chart. Lactation Consultation Note  Patient Name: Kelly Huber [id number] Date: 06/14/2019   Mom had just given a bottle of formula when went in room.  Mom reports [name] had gone 4 hours after breast feeding last night so she thought he was getting more full from her milk, but this morning he has wanted to feed continually.  Explained this was normal around this time period after birth for him to want to cluster feed to tell the breasts to produce more milk, but she has to put baby to breast to achieve this goal.  She was still giving bottles of formula after breast feeding for 10 to 12 minutes.  She continues to deny any nipple pain or trauma.  Mom reports starting not to feel well for last few feedings so has just given bottles of formula.  Reminded mom of importance of frequent and effective breast feeds or to pump to bring in mature milk, ensure a plentiful milk supply and prevent engorgement.  Mom already has DEBP kit if wanted to stimulate with pump as well.  Encouraged mom to offer both breasts before giving formula bottles.  Lactation name and number written on white board and encouraged to call with any questions, concerns or assistance.    Maternal Data    Feeding Feeding Type: Bottle Fed - Formula Nipple Type: Slow - flow  LATCH Score                   Interventions    Lactation Tools Discussed/Used     Consult Status      Kelly Huber 06/14/2019, 3:06 PM

## 2019-06-14 NOTE — Progress Notes (Signed)
Subjective:  Having some abdominal pain and mild headache. Patient has been leaving unit to smoke and has complaint of heaviness in her left leg. She has been evaluated by anesthesia- residual effects of epidural. We discussed plan of care to continue with PO labetalol with dose increase, observe BP over the day and discharge later if stable. She is tolerating regular diet. Her pain is controlled with PO medications. She is ambulating and voiding without difficulty. She is both breast and formula feeding.   Objective:  Vital signs in last 24 hours: Temp:  [97.8 F (36.6 C)-98.5 F (36.9 C)] 98.4 F (36.9 C) (01/25 0004) Pulse Rate:  [62-95] 70 (01/25 0925) Resp:  [14-18] 18 (01/25 0514) BP: (125-153)/(67-90) 152/86 (01/25 0925) SpO2:  [96 %-100 %] 100 % (01/25 0514)   Patient Vitals for the past 24 hrs:  BP Temp Temp src Pulse Resp SpO2  06/14/19 0925 (!) 152/86 -- -- 70 -- --  06/14/19 0514 (!) 153/90 -- -- 62 18 100 %  06/14/19 0004 132/85 98.4 F (36.9 C) Oral 63 16 98 %  06/13/19 2226 139/84 98.5 F (36.9 C) Oral 63 18 100 %  06/13/19 2132 139/88 -- -- 73 -- --  06/13/19 2005 (!) 147/88 -- -- 85 -- 99 %  06/13/19 1905 140/67 97.8 F (36.6 C) Oral 71 15 99 %  06/13/19 1805 134/73 -- -- 70 14 98 %  06/13/19 1800 -- -- -- -- -- 97 %  06/13/19 1750 -- -- -- -- -- 99 %  06/13/19 1745 -- -- -- -- -- 96 %  06/13/19 1740 -- -- -- -- -- 98 %  06/13/19 1735 -- -- -- -- -- 99 %  06/13/19 1730 -- -- -- -- -- 98 %  06/13/19 1725 -- -- -- -- -- 98 %  06/13/19 1720 -- -- -- -- -- 98 %  06/13/19 1715 -- -- -- -- -- 99 %  06/13/19 1710 -- -- -- -- -- 99 %  06/13/19 1705 133/85 -- -- 69 14 98 %  06/13/19 1700 -- -- -- -- -- 99 %  06/13/19 1655 -- -- -- -- -- 97 %  06/13/19 1640 -- -- -- -- -- 98 %  06/13/19 1635 -- -- -- -- -- 98 %  06/13/19 1630 -- -- -- -- -- 98 %  06/13/19 1625 -- -- -- -- -- 99 %  06/13/19 1620 -- -- -- -- -- 97 %  06/13/19 1610 -- -- -- -- -- 99 %  06/13/19  1605 138/75 -- -- 79 14 --  06/13/19 1600 -- -- -- -- -- 99 %  06/13/19 1555 -- -- -- -- -- 98 %  06/13/19 1550 -- -- -- -- -- 98 %  06/13/19 1545 -- -- -- -- -- 98 %  06/13/19 1540 -- -- -- -- -- 97 %  06/13/19 1535 -- -- -- -- -- 97 %  06/13/19 1530 -- -- -- -- -- 97 %  06/13/19 1525 -- -- -- -- -- 97 %  06/13/19 1520 -- -- -- -- -- 98 %  06/13/19 1515 -- -- -- -- -- 98 %  06/13/19 1510 -- -- -- -- -- 99 %  06/13/19 1505 125/77 98 F (36.7 C) Oral 77 14 99 %  06/13/19 1500 -- -- -- -- -- 98 %  06/13/19 1455 -- -- -- -- -- 98 %  06/13/19 1450 -- -- -- -- -- 98 %  06/13/19 1445 -- -- -- -- --  98 %  06/13/19 1440 -- -- -- -- -- 98 %  06/13/19 1435 -- -- -- -- -- 98 %  06/13/19 1430 -- -- -- -- -- 99 %  06/13/19 1425 -- -- -- -- -- 98 %  06/13/19 1420 -- -- -- -- -- 98 %  06/13/19 1415 -- -- -- -- -- 97 %  06/13/19 1410 -- -- -- -- -- 96 %  06/13/19 1405 128/72 -- -- 82 -- 99 %  06/13/19 1400 -- -- -- -- -- 96 %  06/13/19 1355 -- -- -- -- -- 97 %  06/13/19 1350 -- -- -- -- -- 97 %  06/13/19 1345 -- -- -- -- -- 97 %  06/13/19 1340 -- -- -- -- -- 97 %  06/13/19 1330 -- -- -- -- -- 99 %  06/13/19 1325 -- -- -- -- -- 98 %  06/13/19 1320 -- -- -- -- -- 99 %  06/13/19 1315 -- -- -- -- -- 98 %  06/13/19 1305 (!) 145/87 -- -- 95 -- 98 %  06/13/19 1300 -- -- -- -- -- 99 %  06/13/19 1255 -- -- -- -- -- 98 %  06/13/19 1250 -- -- -- -- -- 97 %  06/13/19 1245 -- -- -- -- -- 96 %  06/13/19 1240 -- -- -- -- -- 96 %  06/13/19 1235 -- -- -- -- -- 98 %  06/13/19 1230 -- -- -- -- -- 97 %  06/13/19 1225 -- -- -- -- -- 97 %  06/13/19 1220 -- -- -- -- -- 97 %  06/13/19 1215 -- -- -- -- -- 98 %  06/13/19 1210 -- -- -- -- -- 99 %  06/13/19 1205 128/78 -- -- 77 14 98 %  06/13/19 1200 -- -- -- -- -- 99 %  06/13/19 1159 -- -- -- -- -- 97 %  06/13/19 1155 -- -- -- -- -- 98 %  06/13/19 1150 -- -- -- -- -- 96 %  06/13/19 1145 -- -- -- -- -- 97 %  06/13/19 1140 -- -- -- -- -- 98 %  06/13/19 1135  -- -- -- -- -- 97 %  06/13/19 1130 135/89 -- -- 84 14 --  06/13/19 1125 -- -- -- -- -- 96 %  06/13/19 1120 -- -- -- -- -- 99 %  06/13/19 1110 -- -- -- -- -- 98 %  06/13/19 1105 -- -- -- -- -- 97 %  06/13/19 1100 -- -- -- -- -- 97 %  06/13/19 1055 -- -- -- -- -- 97 %  06/13/19 1050 -- -- -- -- -- 96 %  06/13/19 1045 -- -- -- -- -- 97 %  06/13/19 1040 -- -- -- -- -- 99 %  06/13/19 1035 -- -- -- -- -- 98 %  06/13/19 1030 (!) 145/80 -- -- 75 -- 98 %  06/13/19 1025 -- -- -- -- -- 98 %  06/13/19 1020 -- -- -- -- -- 98 %  06/13/19 1015 -- -- -- -- -- 98 %  06/13/19 1010 -- -- -- -- -- 99 %  06/13/19 1005 -- -- -- -- -- 98 %  06/13/19 1000 -- -- -- -- -- 98 %  06/13/19 0955 -- -- -- -- -- 99 %    General: NAD Pulmonary: no increased work of breathing Abdomen: non-distended, non-tender, fundus firm at level of umbilicus Extremities: no edema, no erythema, no tenderness  Results for orders placed or performed during the  hospital encounter of 06/11/19 (from the past 72 hour(s))  ROM Plus ([facility] only)     Status: None   Collection Time: 06/11/19  3:56 PM  Result Value Ref Range   Rom Plus NEGATIVE     Comment: Performed at [facility], [address] [place], [place] [address]  Protein / creatinine ratio, urine     Status: Abnormal   Collection Time: 06/11/19  4:12 PM  Result Value Ref Range   Creatinine, Urine 24 mg/dL   Total Protein, Urine 16 mg/dL    Comment: NO NORMAL RANGE ESTABLISHED FOR THIS TEST   Protein Creatinine Ratio 0.67 (H) 0.00 - 0.15 mg/mg[Cre]    Comment: Performed at [facility], [address] [place], [place] [address]  CBC     Status: Abnormal   Collection Time: 06/11/19  4:23 PM  Result Value Ref Range   WBC 13.6 (H) 4.0 - 10.5 K/uL   RBC 3.76 (L) 3.87 - 5.11 MIL/uL   Hemoglobin 11.1 (L) 12.0 - 15.0 g/dL   HCT 52.8 (L) 41.3 - 24.4 %   MCV 87.2 80.0 - 100.0 fL   MCH 29.5 26.0 - 34.0 pg   MCHC 33.8 30.0 - 36.0 g/dL   RDW 01.0 27.2 -  53.6 %   Platelets 343 150 - 400 K/uL   nRBC 0.0 0.0 - 0.2 %    Comment: Performed at [facility], [address] [place], [place] [address]  Comprehensive metabolic panel     Status: Abnormal   Collection Time: 06/11/19  4:23 PM  Result Value Ref Range   Sodium 135 135 - 145 mmol/L   Potassium 3.7 3.5 - 5.1 mmol/L   Chloride 107 98 - 111 mmol/L   CO2 18 (L) 22 - 32 mmol/L   Glucose, Bld 81 70 - 99 mg/dL   BUN 10 6 - 20 mg/dL   Creatinine, Ser 4.74 0.44 - 1.00 mg/dL   Calcium 9.4 8.9 - 25.9 mg/dL   Total Protein 7.0 6.5 - 8.1 g/dL   Albumin 3.2 (L) 3.5 - 5.0 g/dL   AST 16 15 - 41 U/L   ALT 13 0 - 44 U/L   Alkaline Phosphatase 132 (H) 38 - 126 U/L   Total Bilirubin 0.6 0.3 - 1.2 mg/dL   GFR calc non Af Amer >60 >60 mL/min   GFR calc Af Amer >60 >60 mL/min   Anion gap 10 5 - 15    Comment: Performed at [facility], [address] [place], [place] [address]  Rapid HIV screen (HIV 1/2 Ab+Ag) ([facility] Only)     Status: None   Collection Time: 06/11/19  4:23 PM  Result Value Ref Range   HIV-1 P24 Antigen - HIV24 NON REACTIVE NON REACTIVE    Comment: (NOTE) Detection of p24 may be inhibited by biotin in the sample, causing false negative results in acute infection.    HIV 1/2 Antibodies NON REACTIVE NON REACTIVE   Interpretation (HIV Ag Ab)      A non reactive test result means that HIV 1 or HIV 2 antibodies and HIV 1 p24 antigen were not detected in the specimen.    Comment: Performed at [facility], [address] [place], [place] [address]  Respiratory Panel by RT PCR (Flu A&B, Covid) - Nasopharyngeal Swab     Status: None   Collection Time: 06/11/19  7:33 PM   Specimen: Nasopharyngeal Swab  Result Value Ref Range  SARS Coronavirus 2 by RT PCR NEGATIVE NEGATIVE    Comment: (NOTE) SARS-CoV-2 target nucleic acids are NOT DETECTED. The SARS-CoV-2 RNA is generally detectable in upper respiratoy specimens during the acute phase of infection. The  lowest concentration of SARS-CoV-2 viral copies this assay can detect is 131 copies/mL. A negative result does not preclude SARS-Cov-2 infection and should not be used as the sole basis for treatment or other patient management decisions. A negative result may occur with  improper specimen collection/handling, submission of specimen other than nasopharyngeal swab, presence of viral mutation(s) within the areas targeted by this assay, and inadequate number of viral copies (<131 copies/mL). A negative result must be combined with clinical observations, patient history, and epidemiological information. The expected result is Negative. Fact Sheet for Patients:  https://www.moore.com/https://www.fda.gov/media/142436/download Fact Sheet for Healthcare Providers:  https://www.young.biz/https://www.fda.gov/media/142435/download This test is not yet ap proved or cleared by the [place] FDA and  has been authorized for detection and/or diagnosis of SARS-CoV-2 by FDA under an Emergency Use Authorization (EUA). This EUA will remain  in effect (meaning this test can be used) for the duration of the COVID-19 declaration under Section 564(b)(1) of the Act, 21 U.S.C. section 360bbb-3(b)(1), unless the authorization is terminated or revoked sooner.    Influenza A by PCR NEGATIVE NEGATIVE   Influenza B by PCR NEGATIVE NEGATIVE    Comment: (NOTE) The Xpert Xpress SARS-CoV-2/FLU/RSV assay is intended as an aid in  the diagnosis of influenza from Nasopharyngeal swab specimens and  should not be used as a sole basis for treatment. Nasal washings and  aspirates are unacceptable for Xpert Xpress SARS-CoV-2/FLU/RSV  testing. Fact Sheet for Patients: https://www.moore.com/https://www.fda.gov/media/142436/download Fact Sheet for Healthcare Providers: https://www.young.biz/https://www.fda.gov/media/142435/download This test is not yet approved or cleared by the [place] FDA and  has been authorized for detection and/or diagnosis of SARS-CoV-2 by  FDA under an Emergency Use  Authorization (EUA). This EUA will remain  in effect (meaning this test can be used) for the duration of the  Covid-19 declaration under Section 564(b)(1) of the Act, 21  U.S.C. section 360bbb-3(b)(1), unless the authorization is  terminated or revoked. Performed at [facility], [address] [place], [place] [address]   Type and screen [facility]     Status: None   Collection Time: 06/11/19 10:51 PM  Result Value Ref Range   ABO/RH(D) O POS    Antibody Screen NEG    Sample Expiration      06/14/2019,2359 Performed at [facility], [address] [place], [place] [address]   RPR     Status: None   Collection Time: 06/11/19 10:51 PM  Result Value Ref Range   RPR Ser Ql NON REACTIVE NON REACTIVE    Comment: Performed at [facility], [address] [place], [place] [address]  CBC     Status: Abnormal   Collection Time: 06/12/19  5:56 PM  Result Value Ref Range   WBC 14.4 (H) 4.0 - 10.5 K/uL   RBC 3.89 3.87 - 5.11 MIL/uL   Hemoglobin 11.6 (L) 12.0 - 15.0 g/dL   HCT 91.434.0 (L) 78.236.0 - 95.646.0 %   MCV 87.4 80.0 - 100.0 fL   MCH 29.8 26.0 - 34.0 pg   MCHC 34.1 30.0 - 36.0 g/dL   RDW 21.314.6 08.611.5 - 57.815.5 %   Platelets 299 150 - 400 K/uL   nRBC 0.0 0.0 - 0.2 %    Comment: Performed at [facility], [address] [place], [place] [address]  Comprehensive metabolic panel     Status: Abnormal   Collection Time: 06/12/19  5:56 PM  Result Value Ref Range   Sodium 130 (L) 135 - 145 mmol/L   Potassium 3.9 3.5 - 5.1 mmol/L   Chloride 102 98 - 111 mmol/L   CO2 19 (L) 22 - 32 mmol/L   Glucose, Bld 128 (H) 70 - 99 mg/dL   BUN 8 6 - 20 mg/dL   Creatinine, Ser 9.62 0.44 - 1.00 mg/dL   Calcium 7.4 (L) 8.9 - 10.3 mg/dL   Total Protein 7.1 6.5 - 8.1 g/dL   Albumin 3.1 (L) 3.5 - 5.0 g/dL   AST 20 15 - 41 U/L   ALT 13 0 - 44 U/L   Alkaline Phosphatase 133 (H) 38 - 126 U/L   Total Bilirubin 0.5 0.3 - 1.2 mg/dL   GFR calc non Af Amer >60  >60 mL/min   GFR calc Af Amer >60 >60 mL/min   Anion gap 9 5 - 15    Comment: Performed at [facility], [address] [place], [place] [address]  CBC     Status: Abnormal   Collection Time: 06/13/19  6:03 AM  Result Value Ref Range   WBC 17.6 (H) 4.0 - 10.5 K/uL   RBC 3.50 (L) 3.87 - 5.11 MIL/uL   Hemoglobin 10.5 (L) 12.0 - 15.0 g/dL   HCT 13.2 (L) 44.0 - 10.2 %   MCV 87.7 80.0 - 100.0 fL   MCH 30.0 26.0 - 34.0 pg   MCHC 34.2 30.0 - 36.0 g/dL   RDW 72.5 36.6 - 44.0 %   Platelets 292 150 - 400 K/uL   nRBC 0.0 0.0 - 0.2 %    Comment: Performed at [facility], [address] [place], [place] [address]    Assessment:   44 y.o. [id number] postpartum day # 2, lactating  Plan:    1) Acute blood loss anemia - hemodynamically stable and asymptomatic - po ferrous sulfate  2) Blood Type --/--/O POS (01/22 2251) / Rubella 2.53 (07/08 1051) / Varicella Immune  3) TDAP status declines  4) Feeding plan breast and formula  5)  Education given regarding options for contraception, as well as compatibility with breast feeding if applicable.  Patient plans on oral progesterone-only contraceptive for contraception.  6) Superimposed preeclampsia: increase labetalol dose to 300 mg bid  6) Disposition: continue current care with d/c later in the day if blood pressure is stable   Tresea Mall, CNM [facility] 06/14/2019, 9:45 AM

## 2019-06-14 NOTE — Anesthesia Postprocedure Evaluation (Addendum)
Anesthesia Post Note  Patient: Kelly Huber  Procedure(s) Performed: AN AD HOC LABOR EPIDURAL  Patient location during evaluation: Mother Baby Anesthesia Type: Epidural Level of consciousness: awake and alert Pain management: pain level controlled Vital Signs Assessment: post-procedure vital signs reviewed and stable Respiratory status: spontaneous breathing, nonlabored ventilation and respiratory function stable Cardiovascular status: stable Postop Assessment: no headache, no backache, epidural receding and able to ambulate Anesthetic complications: no Comments: Pt able to ambulate. Pt states "left leg feels heavy". Numbness noted over left anterior thigh area. Pt reports extreme positioning during labor. Physician and Anesthesia MD made aware.     Last Vitals:  Vitals:   06/14/19 1030 06/14/19 1245  BP: 132/87 (!) 142/92  Pulse: 69 65  Resp:  18  Temp:  36.4 C  SpO2:  100%    Last Pain:  Vitals:   06/14/19 1245  TempSrc: Oral  PainSc:                  Rosanne Gutting

## 2019-06-14 NOTE — Progress Notes (Signed)
Patient educated multiple times about not going outside due to unsteady gate. Patient insisted on exiting the building for "fresh air". Patient is currently off the unit.

## 2019-06-14 NOTE — Progress Notes (Signed)
Patient and husband given Infant CPR video to watch. Currently watching video.

## 2019-06-14 NOTE — Lactation Note (Signed)
This note was copied from a baby's chart. Lactation Consultation Note  Patient Name: Boy Betsabe Iglesia [id number] Date: 06/14/2019   Mom has not put Ah'Miliyin to the breast since 0830 am.  Mom reports breasts starting to feel fuller.  No hard or lumpy areas noted.  Set up [employer] DEBP with instructions in breast massage, hand expression, pumping, collection, storage, cleaning, labeling and handling of breast milk.  Mom says she is going to call her [facility] peer counselor tomorrow and see if she can get loaner pump.  Stressed once again the need to drain breast preferrably by breast feeding, but if not to at least pump every 3 hours.  Lactation [employer] manager with contact numbers given and reviewed encouraging mom to call with any questions, concerns or assistance.  Maternal Data    Feeding Feeding Type: Bottle Fed - Formula Nipple Type: Slow - flow  LATCH Score                   Interventions    Lactation Tools Discussed/Used     Consult Status      Louis Meckel 06/14/2019, 8:09 PM

## 2019-06-15 ENCOUNTER — Encounter: Payer: Medicaid Other | Admitting: Obstetrics and Gynecology

## 2019-06-15 MED ORDER — NIFEDIPINE ER OSMOTIC RELEASE 30 MG PO TB24
60.0000 mg | ORAL_TABLET | Freq: Every day | ORAL | Status: DC
  Administered 2019-06-15: 60 mg via ORAL
  Filled 2019-06-15: qty 2

## 2019-06-15 MED ORDER — NIFEDIPINE ER OSMOTIC RELEASE 30 MG PO TB24
90.0000 mg | ORAL_TABLET | Freq: Every day | ORAL | Status: DC
  Administered 2019-06-16: 90 mg via ORAL
  Filled 2019-06-15: qty 3

## 2019-06-15 MED ORDER — CLONIDINE HCL 0.1 MG PO TABS
0.1000 mg | ORAL_TABLET | Freq: Once | ORAL | Status: AC | PRN
  Administered 2019-06-15: 0.1 mg via ORAL
  Filled 2019-06-15: qty 1

## 2019-06-15 MED ORDER — NIFEDIPINE ER OSMOTIC RELEASE 30 MG PO TB24
30.0000 mg | ORAL_TABLET | Freq: Once | ORAL | Status: AC
  Administered 2019-06-15: 30 mg via ORAL
  Filled 2019-06-15: qty 1

## 2019-06-15 NOTE — Progress Notes (Signed)
Patient had stable blood pressures throughout most of the night. 130/79 at midnight and 139/82 at approximately 0330. Patient walked around on the unit and walked off of the unit around 0530. RN rechecked blood pressure at 0615 to assess and blood pressure was outside of parameters at 170/93. Fifteen minute recheck was 165/91 and patient received appropriate dose of 20 mg labetalol. Next ten minute recheck was 162/90 warranting 40 mg of labetalol, however patient refused. Kelly Huber made aware. Patient stated she does not want to take labetalol and would prefer nifedipine. Midwife stated she would order the appropriate dose of nifedipine for the patient.

## 2019-06-15 NOTE — Progress Notes (Signed)
Spoke with Dr. Jean Rosenthal regarding increased BP and pt refusing IV labetalol. New order for Procardia 60mg  bid but in, and labetalol 300mg  bid discontinued. OK to go ahead and give procardia dose, and to discuss giving pt IV hydralazine in addition to procardia instead of IV labetalol.

## 2019-06-15 NOTE — Progress Notes (Signed)
PO procardia given, pt refused IV hydralazine at this time. Dr. Jean Rosenthal notified, on unit to see patient.

## 2019-06-15 NOTE — Progress Notes (Signed)
Pt refused recheck of vital signs at this time. Asked if we could take them after she finished breakfast. Will check back in with her

## 2019-06-15 NOTE — Progress Notes (Signed)
Pts Bp 160/92, HR 62, O2 100% at 1118. Pt denies blurry vision, headache, and seeing floaters. Treated with clonidine .1mg  at 1125. Will reassess BP.

## 2019-06-15 NOTE — Progress Notes (Signed)
Vital signs taken at 0806 BP 178/98, HR 66, O2 100%. Pt denies headache, blurry vision, seeing floaters. Reflexes plus 1, absent for clonus. BP recheck done at 0822 178/88, HR 65, O2 100%. Educated pt on need to treat elevated BP with IV labetalol. Patient refused. MD Jean Rosenthal paged.

## 2019-06-15 NOTE — Progress Notes (Signed)
   06/15/19 1600  Clinical Encounter Type  Visited With Patient and family together  Visit Type Initial  Referral From Nurse  Consult/Referral To Chaplain  While rounding nurse told Chaplain that patient might benefit from a visit. The first time Chaplain entered the room, patient was on the telephone. Chaplain went back later in the day and spoke with patient and guess. Patient is working on getting her blood pressure down. Patient said that her pressure has come down the last two times it was taking. Patient is working on getting pressure at an acceptable level so she can go home. Chaplain offered pastoral presence and empathy.

## 2019-06-15 NOTE — Progress Notes (Signed)
Obstetric Postpartum Daily Progress Note Subjective:  44 y.o. [id number] postpartum day #3 status post vaginal delivery.  She is ambulating, is tolerating po, is voiding spontaneously.  Her pain is well controlled on PO pain medications. Her lochia is less than menses.  She has intermittent headaches she associates with elevated blood pressure.  She notes some floaters in her vision when her BP is elevated.  She has continued to have severe-rage blood pressures. She does not believe labetalol is helping now that she is no longer pregnant and has declined treatment with maintenance and acute treatment for severe-range blood pressures.    Medications SCHEDULED MEDICATIONS  . ibuprofen  600 mg Oral Q6H  . NIFEdipine  60 mg Oral Daily  . prenatal multivitamin  1 tablet Oral Q1200  . senna-docusate  2 tablet Oral Q24H    MEDICATION INFUSIONS  . lactated ringers Stopped (06/13/19 2037)  . magnesium sulfate Stopped (06/13/19 2037)    PRN MEDICATIONS  acetaminophen, benzocaine-Menthol, coconut oil, witch hazel-glycerin **AND** dibucaine, diphenhydrAMINE, labetalol **AND** labetalol **AND** labetalol **AND** hydrALAZINE **AND** Measure blood pressure, ondansetron **OR** ondansetron (ZOFRAN) IV, oxyCODONE-acetaminophen, oxyCODONE-acetaminophen, simethicone, zolpidem    Objective:   Vitals:   06/15/19 0630 06/15/19 0640 06/15/19 0806 06/15/19 0822  BP: (!) 165/91 (!) 162/90 (!) 178/98 (!) 178/88  Pulse: 65 66 66 65  Resp: 18 17 18    Temp:   98.6 F (37 C)   TempSrc:   Oral   SpO2:   100% 100%  Weight:      Height:        Current Vital Signs 24h Vital Sign Ranges  T 98.6 F (37 C) Temp  Avg: 98.5 F (36.9 C)  Min: 97.6 F (36.4 C)  Max: 98.9 F (37.2 C)  BP (!) 178/88 BP  Min: 130/79  Max: 178/88  HR 65 Pulse  Avg: 66.1  Min: 63  Max: 73  RR 18 Resp  Avg: 17.4  Min: 16  Max: 18  SaO2 100 % (room air) SpO2  Avg: 100 %  Min: 100 %  Max: 100 %       24 Hour I/O Current Shift I/O   Time Ins Outs No intake/output data recorded. No intake/output data recorded.  General: NAD Pulmonary: CTABL Abdomen: non-distended, non-tender, fundus firm at level of umbilicus Extremities: no edema, no erythema, no tenderness, trace-1+ bilateral, symmetrical pitting edema.  Labs:  Recent Labs  Lab 06/11/19 1623 06/12/19 1756 06/13/19 0603  WBC 13.6* 14.4* 17.6*  HGB 11.1* 11.6* 10.5*  HCT 32.8* 34.0* 30.7*  PLT 343 299 292     Assessment:   44 y.o. [id number] postpartum day # 3 status post SVD, complicated by superimposed preeclampsia, not well controlled.   Plan:   1) Acute blood loss anemia - hemodynamically stable and asymptomatic - po ferrous sulfate  2) O POS /  Information for the patient's newborn:  Johnika, Escareno [[id number]]  A POS   / Rubella 2.53 (07/08 1051)/ Varicella Immune  3) TDAP status declined  4) breast and formula feeding /Contraception = oral progesterone-only contraceptive  5) Superimposed severe preeclampsia:  Change labetalol to nifedipine. Will start with 60 mg of XL nifedipine. Low threshold to increase to 90 mg.  Discussed treatment strategies for severe-range blood pressures. She accepts treatment with nifedipine XL and intermittent use of hydralizine.  May consider use of clonidine tablet vs clonidine patch for severe-range BPs.  6) Disposition: home when clinically stable from BP standpoint.   Annie Main  Jean Rosenthal, MD 06/15/2019 9:14 AM

## 2019-06-16 DIAGNOSIS — O165 Unspecified maternal hypertension, complicating the puerperium: Secondary | ICD-10-CM | POA: Diagnosis not present

## 2019-06-16 DIAGNOSIS — O119 Pre-existing hypertension with pre-eclampsia, unspecified trimester: Secondary | ICD-10-CM | POA: Diagnosis present

## 2019-06-16 DIAGNOSIS — Z3490 Encounter for supervision of normal pregnancy, unspecified, unspecified trimester: Secondary | ICD-10-CM | POA: Diagnosis present

## 2019-06-16 MED ORDER — SENNOSIDES-DOCUSATE SODIUM 8.6-50 MG PO TABS: 2.0000 | ORAL_TABLET | ORAL | Status: DC

## 2019-06-16 MED ORDER — OXYCODONE-ACETAMINOPHEN 5-325 MG PO TABS: 1.0000 | ORAL_TABLET | Freq: Four times a day (QID) | ORAL | 0 refills | Status: AC | PRN

## 2019-06-16 MED ORDER — IBUPROFEN 600 MG PO TABS: 600.0000 mg | ORAL_TABLET | Freq: Four times a day (QID) | ORAL | 0 refills | Status: DC

## 2019-06-16 MED ORDER — NORETHINDRONE 0.35 MG PO TABS: 1.0000 | ORAL_TABLET | Freq: Every day | ORAL | 11 refills | Status: AC

## 2019-06-16 MED ORDER — NIFEDIPINE ER OSMOTIC RELEASE 90 MG PO TB24: 90.0000 mg | ORAL_TABLET | Freq: Every day | ORAL | 2 refills | Status: DC

## 2019-06-16 MED ORDER — SERTRALINE HCL 50 MG PO TABS: ORAL_TABLET | ORAL | 1 refills | Status: AC

## 2019-06-16 NOTE — Progress Notes (Deleted)
Pt anxious due to increased bilirubin in infant, and needing to start light therapy. Refused vital signs at this time. Will let us know when we can take them. Will continue to monitor

## 2019-06-16 NOTE — Progress Notes (Signed)
Post Partum Day 4 SVD after IOL for preeclampsia with severe features. Was on magnesium sulfate x 24 hours after delivery. Started on Procardia XL yesterday for severe range blood pressures. Received a total of 90 mgm Procardia XL yesterday and also 0.1 mgm clonidine orally.  Subjective: up ad lib, voiding and tolerating PO. Continues to have intermittent frontal headaches and lower abdominal cramping. Notices the cramping more when she is pumping or breast feeding. Has not had the floaters in her vision this AM. Baby now under bili lights. Bleeding ranges from light to moderate. Passed a clot the size of her fist yesterday. Passing flatus.   Objective: Blood pressure (!) 149/96, pulse 79, temperature 98.7 F (37.1 C), temperature source Oral, resp. rate 20, height 5\' 3"  (1.6 m), weight 86.6 kg, last menstrual period 09/11/2018, SpO2 100 %, unknown if currently breastfeeding. Since midnight blood pressures 126/88, 155/92, 146/96. Given Procardia 90 mgm XL after the last blood pressure Physical Exam:  General: alert, cooperative and no distress  Heart: RRR without murmur Lungs: normal respiratory effort/ CTA at bases bilaterally Lochia: appropriate Uterine Fundus: firm/ U-1/ML/NT DVT Evaluation: No evidence of DVT seen on physical exam. Trace edema in lower extremities  CBC Latest Ref Rng & Units 06/13/2019 06/12/2019 06/11/2019  WBC 4.0 - 10.5 K/uL 17.6(H) 14.4(H) 13.6(H)  Hemoglobin 12.0 - 15.0 g/dL 10.5(L) 11.6(L) 11.1(L)  Hematocrit 36.0 - 46.0 % 30.7(L) 34.0(L) 32.8(L)  Platelets 150 - 400 K/uL 292 299 343    Assessment/Plan: PPD #4-improving Preeclampsia with severe features  Decreasing blood pressures on current regime started yesterday  Discharge home later today if blood pressures remain in mild to normal range  Add clonidine if blood pressures not controlled with Procardia O POS/ RI/ VI TDAP-declined Pumping and breast feeding Contraception: minipill History of PPD-offer  antidepressant before discharge  06/13/2019, CNM    LOS: 5 days   Farrel Conners 06/16/2019, 10:18 AM

## 2019-06-16 NOTE — Progress Notes (Signed)
Pt anxious due to increased bilirubin, and needing to start light therapy. Refused vital signs at this time. Will let us know when we can take them. Will continue to monitor.

## 2019-06-16 NOTE — Progress Notes (Signed)
Patient discharged home with infant. Discharge instructions, prescriptions and follow up appointment given to and reviewed with patient. Heavy discharge teaching done on postpartum hypertension and postpartum depression.  Patient verbalized understanding.Placenta consent to release specimen signed and placenta given to patient at discharge. Patient wheeled out with infant at discharge

## 2019-06-16 NOTE — Discharge Instructions (Signed)
Please call your doctor or return to the ER if you experience any chest pains, shortness of breath, dizziness, visual changes, severe headache (unrelieved by pain meds), fever greater than 101, any heavy bleeding (saturating more than 1 pad per hour), large clots, or foul smelling discharge, any worsening abdominal pain and cramping that is not controlled by pain medication, any calf/leg pain or redness, any breast concerns (redness/pain), or any signs of postpartum depression. No tampons, enemas, douches, or sexual intercourse for 6 weeks. Also avoid tub baths, hot tubs, or swimming for 6 weeks.   Start your minipill on 21 February  Start taking Zoloft 1/2 tablet daily for 7 weeks then increase to 1 tab daily after that  Return to [facility] for a blood pressure check on 1/29

## 2019-06-18 ENCOUNTER — Ambulatory Visit (INDEPENDENT_AMBULATORY_CARE_PROVIDER_SITE_OTHER): Payer: Medicaid Other | Admitting: Obstetrics and Gynecology

## 2019-06-18 ENCOUNTER — Encounter: Payer: Self-pay | Admitting: Obstetrics and Gynecology

## 2019-06-18 ENCOUNTER — Encounter: Payer: Medicaid Other | Admitting: Obstetrics and Gynecology

## 2019-06-18 ENCOUNTER — Other Ambulatory Visit: Payer: Medicaid Other

## 2019-06-18 ENCOUNTER — Ambulatory Visit: Payer: Medicaid Other | Admitting: Obstetrics and Gynecology

## 2019-06-18 ENCOUNTER — Other Ambulatory Visit: Payer: Self-pay

## 2019-06-18 VITALS — BP 146/102 | HR 90 | Ht 63.0 in | Wt 179.0 lb

## 2019-06-18 DIAGNOSIS — Z0130 Encounter for examination of blood pressure without abnormal findings: Secondary | ICD-10-CM

## 2019-06-18 NOTE — Progress Notes (Signed)
Obstetrics & Gynecology Office Visit   Chief Complaint:  Chief Complaint  Patient presents with  . Postpartum Care    vaginal delivery 1/22    History of Present Illness: 44 y.o. [id number] being seen for follow up blood pressure check today.  The patient is postpartumThe established diagnosis for the patient is superimposed preeclampsia.  She is currently on nifedipine ER 90mg .  She reports headaches.  Medication list reviewed medications which may contribute to BP elevation were not noted and no medications contraindicated for use in patient with current hypertension were noted.  Does still have significant uterine cramping.  Is breast and bottle feeding  Review of Systems: Review of Systems  Constitutional: Negative.   Gastrointestinal: Positive for abdominal pain.  Genitourinary: Negative.   Neurological: Positive for headaches.     Past Medical History:  Past Medical History:  Diagnosis Date  . Advanced maternal age in multigravida   . Alcohol intoxication with delirium ([place]) 12/28/2016  . Chronic hypertension   . Gestational diabetes   . Gestational hypertension 02/29/2016  . History of postpartum depression   . Marijuana use     Past Surgical History:  Past Surgical History:  Procedure Laterality Date  . cataract surgery    . dilation and currette      Gynecologic History: No LMP recorded.  Obstetric History: [id number]  Family History:  Family History  Problem Relation Age of Onset  . Hypertension Mother   . Bipolar disorder Father   . Schizophrenia Father   . Bipolar disorder Brother        bothers x2    Social History:  Social History   Socioeconomic History  . Marital status: Married    Spouse name: Not on file  . Number of children: Not on file  . Years of education: Not on file  . Highest education level: Not on file  Occupational History  . Not on file  Tobacco Use  . Smoking status: Former Smoker    Packs/day: 0.25    Years: 20.00   Pack years: 5.00    Types: Cigarettes    Quit date: 12/19/2015    Years since quitting: 3.5  . Smokeless tobacco: Never Used  Substance and Sexual Activity  . Alcohol use: No  . Drug use: Yes    Types: Marijuana    Comment: none in last month  . Sexual activity: Yes    Partners: Male    Birth control/protection: None  Other Topics Concern  . Not on file  Social History Narrative  . Not on file   Social Determinants of Health   Financial Resource Strain:   . Difficulty of Paying Living Expenses: Not on file  Food Insecurity:   . Worried About Charity fundraiser in the Last Year: Not on file  . Ran Out of Food in the Last Year: Not on file  Transportation Needs:   . Lack of Transportation (Medical): Not on file  . Lack of Transportation (Non-Medical): Not on file  Physical Activity:   . Days of Exercise per Week: Not on file  . Minutes of Exercise per Session: Not on file  Stress:   . Feeling of Stress : Not on file  Social Connections:   . Frequency of Communication with Friends and Family: Not on file  . Frequency of Social Gatherings with Friends and Family: Not on file  . Attends Religious Services: Not on file  . Active Member of Clubs or  Organizations: Not on file  . Attends Banker Meetings: Not on file  . Marital Status: Not on file  Intimate Partner Violence:   . Fear of Current or Ex-Partner: Not on file  . Emotionally Abused: Not on file  . Physically Abused: Not on file  . Sexually Abused: Not on file    Allergies:  Allergies  Allergen Reactions  . Codeine Hives and Shortness Of Breath  . Penicillins Hives and Shortness Of Breath    Medications: Prior to Admission medications   Medication Sig Start Date End Date Taking? Authorizing Provider  cetirizine (ZYRTEC) 10 MG tablet Take 10 mg by mouth daily.   Yes [provider]  ferrous sulfate (FERROUSUL) 325 (65 FE) MG tablet Take 1 tablet (325 mg total) by mouth daily with  breakfast. 04/21/19  Yes Vena Austria, MD  fluticasone (FLONASE) 50 MCG/ACT nasal spray Place into both nostrils daily.   Yes [provider]  hydrocortisone (ANUSOL-HC) 2.5 % rectal cream Place 1 application rectally 2 (two) times daily. 03/02/19  Yes Vena Austria, MD  ibuprofen (ADVIL) 600 MG tablet Take 1 tablet (600 mg total) by mouth every 6 (six) hours. 06/16/19  Yes Farrel Conners, CNM  nicotine (NICODERM CQ - DOSED IN MG/24 HOURS) 14 mg/24hr patch Place 14 mg onto the skin daily.   Yes [provider]  NIFEdipine (PROCARDIA XL/NIFEDICAL-XL) 90 MG 24 hr tablet Take 1 tablet (90 mg total) by mouth daily. 06/17/19  Yes Farrel Conners, CNM  norethindrone (MICRONOR) 0.35 MG tablet Take 1 tablet (0.35 mg total) by mouth daily. 07/11/19  Yes Farrel Conners, CNM  omeprazole (PRILOSEC) 20 MG capsule Take 1 capsule (20 mg total) by mouth daily. 03/02/19  Yes Vena Austria, MD  oxyCODONE-acetaminophen (PERCOCET/ROXICET) 5-325 MG tablet Take 1 tablet by mouth every 6 (six) hours as needed for up to 5 days for severe pain (>7). 06/16/19 06/21/19 Yes Farrel Conners, CNM  prenatal vitamin w/FE, FA (PRENATAL 1 + 1) 27-1 MG TABS tablet Prenatal Plus (calcium carbonate) 27 mg iron-1 mg tablet  TAKE 1 TABLET BY MOUTH DAILY WHILE TRYING TO CONCEIVE, PREGNANT & BREASFEEDING   Yes [provider]  sertraline (ZOLOFT) 50 MG tablet Take 0.5 tablet daily x 7 days then increase to 1 tablet daily 06/16/19  Yes Farrel Conners, CNM  triamcinolone cream (KENALOG) 0.5 % Apply 1 application topically 3 (three) times daily as needed.   Yes [provider]    Physical Exam Blood pressure (!) 146/102, pulse 90, height 5\' 3"  (1.6 m), weight 179 lb (81.2 kg), unknown if currently breastfeeding.  No LMP recorded.  General: NAD HEENT: normocephalic, anicteric Pulmonary: No increased work of breathing Extremities: noedema, no erythema, no tenderness Neurologic:  Grossly intact Psychiatric: mood appropriate, affect full  Assessment: 44 y.o. 46 presenting for blood pressure evaluation today  Plan: Problem List Items Addressed This Visit    None    Visit Diagnoses    BP check    -  Primary      1) Blood pressure - blood pressure at today's visit is elevated.  As a result continue nifedipin XR 90mg . - additional blood work was not obtained - continue nifedipine XR 90mg  - given sample slynd (2 months)  Return in about 1 week (around 06/25/2019), or BP check.   , MD, [facility], [facility] 06/18/2019, 2:06 PM

## 2019-06-25 ENCOUNTER — Encounter: Payer: Self-pay | Admitting: Obstetrics and Gynecology

## 2019-06-25 ENCOUNTER — Ambulatory Visit (INDEPENDENT_AMBULATORY_CARE_PROVIDER_SITE_OTHER): Payer: Medicaid Other | Admitting: Obstetrics and Gynecology

## 2019-06-25 ENCOUNTER — Other Ambulatory Visit: Payer: Self-pay

## 2019-06-25 VITALS — BP 146/96 | Wt 174.0 lb

## 2019-06-25 DIAGNOSIS — K602 Anal fissure, unspecified: Secondary | ICD-10-CM | POA: Diagnosis not present

## 2019-06-25 DIAGNOSIS — R109 Unspecified abdominal pain: Secondary | ICD-10-CM

## 2019-06-25 DIAGNOSIS — O119 Pre-existing hypertension with pre-eclampsia, unspecified trimester: Secondary | ICD-10-CM

## 2019-06-25 DIAGNOSIS — O1003 Pre-existing essential hypertension complicating the puerperium: Secondary | ICD-10-CM

## 2019-06-25 DIAGNOSIS — Z0131 Encounter for examination of blood pressure with abnormal findings: Secondary | ICD-10-CM

## 2019-06-25 DIAGNOSIS — R519 Headache, unspecified: Secondary | ICD-10-CM

## 2019-06-25 MED ORDER — HYDROCODONE-ACETAMINOPHEN 5-325 MG PO TABS: 1.0000 | ORAL_TABLET | Freq: Four times a day (QID) | ORAL | 0 refills | Status: DC | PRN

## 2019-06-25 MED ORDER — LIDOCAINE-PRILOCAINE 2.5-2.5 % EX CREA: 1.0000 "application " | TOPICAL_CREAM | CUTANEOUS | 0 refills | Status: AC | PRN

## 2019-06-25 NOTE — Progress Notes (Signed)
Obstetrics & Gynecology Office Visit   Chief Complaint:  Chief Complaint  Patient presents with  . Postpartum Care    pp B/P check    History of Present Illness: 44 y.o. [id number] being seen for follow up blood pressure check today.  The patient is postpartum. The established diagnosis for the patient is superimposed preeclampsia.  She is currently on nifedipine ER 90mg .  She reports headaches.  She also reports continued contraction type cramping.  No fevers, chills, or foul smelling lochia.  She does report that she was constipated and believes she received a anal laceration, some light bleeding at the time but discomfort since.  Medication list reviewed medications which may contribute to BP elevation were not noted and no medications contraindicated for use in patient with current hypertension were noted.  Review of Systems: Review of Systems  Constitutional: Negative.   Gastrointestinal: Positive for abdominal pain.  Neurological: Positive for headaches.     Past Medical History:  Past Medical History:  Diagnosis Date  . Advanced maternal age in multigravida   . Alcohol intoxication with delirium ([facility]) 12/28/2016  . Chronic hypertension   . Gestational diabetes   . Gestational hypertension 02/29/2016  . History of postpartum depression   . Marijuana use     Past Surgical History:  Past Surgical History:  Procedure Laterality Date  . cataract surgery    . dilation and currette      Gynecologic History: No LMP recorded.  Obstetric History: 04/30/2016  Family History:  Family History  Problem Relation Age of Onset  . Hypertension Mother   . Bipolar disorder Father   . Schizophrenia Father   . Bipolar disorder Brother        bothers x2    Social History:  Social History   Socioeconomic History  . Marital status: Married    Spouse name: Not on file  . Number of children: Not on file  . Years of education: Not on file  . Highest education level: Not on file   Occupational History  . Not on file  Tobacco Use  . Smoking status: Former Smoker    Packs/day: 0.25    Years: 20.00    Pack years: 5.00    Types: Cigarettes    Quit date: 12/19/2015    Years since quitting: 3.5  . Smokeless tobacco: Never Used  Substance and Sexual Activity  . Alcohol use: No  . Drug use: Yes    Types: Marijuana    Comment: none in last month  . Sexual activity: Yes    Partners: Male    Birth control/protection: None  Other Topics Concern  . Not on file  Social History Narrative  . Not on file   Social Determinants of Health   Financial Resource Strain:   . Difficulty of Paying Living Expenses: Not on file  Food Insecurity:   . Worried About 02/18/2016 in the Last Year: Not on file  . Ran Out of Food in the Last Year: Not on file  Transportation Needs:   . Lack of Transportation (Medical): Not on file  . Lack of Transportation (Non-Medical): Not on file  Physical Activity:   . Days of Exercise per Week: Not on file  . Minutes of Exercise per Session: Not on file  Stress:   . Feeling of Stress : Not on file  Social Connections:   . Frequency of Communication with Friends and Family: Not on file  .  Frequency of Social Gatherings with Friends and Family: Not on file  . Attends Religious Services: Not on file  . Active Member of Clubs or Organizations: Not on file  . Attends Archivist Meetings: Not on file  . Marital Status: Not on file  Intimate Partner Violence:   . Fear of Current or Ex-Partner: Not on file  . Emotionally Abused: Not on file  . Physically Abused: Not on file  . Sexually Abused: Not on file    Allergies:  Allergies  Allergen Reactions  . Codeine Hives and Shortness Of Breath  . Penicillins Hives and Shortness Of Breath    Medications: Prior to Admission medications   Medication Sig Start Date End Date Taking? Authorizing Provider  cetirizine (ZYRTEC) 10 MG tablet Take 10 mg by mouth daily.    [provider]  ferrous sulfate (FERROUSUL) 325 (65 FE) MG tablet Take 1 tablet (325 mg total) by mouth daily with breakfast. 04/21/19   Malachy Mood, MD  fluticasone [facility]) 50 MCG/ACT nasal spray Place into both nostrils daily.    [provider]  hydrocortisone (ANUSOL-HC) 2.5 % rectal cream Place 1 application rectally 2 (two) times daily. 03/02/19   Malachy Mood, MD  ibuprofen (ADVIL) 600 MG tablet Take 1 tablet (600 mg total) by mouth every 6 (six) hours. 06/16/19   Dalia Heading, CNM  nicotine (NICODERM CQ - DOSED IN MG/24 HOURS) 14 mg/24hr patch Place 14 mg onto the skin daily.    [provider]  NIFEdipine (PROCARDIA XL/NIFEDICAL-XL) 90 MG 24 hr tablet Take 1 tablet (90 mg total) by mouth daily. 06/17/19   Dalia Heading, CNM  norethindrone (MICRONOR) 0.35 MG tablet Take 1 tablet (0.35 mg total) by mouth daily. 07/11/19   Dalia Heading, CNM  omeprazole (PRILOSEC) 20 MG capsule Take 1 capsule (20 mg total) by mouth daily. 03/02/19   Malachy Mood, MD  prenatal vitamin w/FE, FA (PRENATAL 1 + 1) 27-1 MG TABS tablet Prenatal Plus (calcium carbonate) 27 mg iron-1 mg tablet  TAKE 1 TABLET BY MOUTH DAILY WHILE TRYING TO CONCEIVE, PREGNANT & BREASFEEDING    [provider]  sertraline (ZOLOFT) 50 MG tablet Take 0.5 tablet daily x 7 days then increase to 1 tablet daily 06/16/19   Dalia Heading, CNM  triamcinolone cream (KENALOG) 0.5 % Apply 1 application topically 3 (three) times daily as needed.    [provider]    Physical Exam Blood pressure (!) 146/96, weight 174 lb (78.9 kg), unknown if currently breastfeeding.  No LMP recorded.  General: NAD HEENT: normocephalic, anicteric Pulmonary: No increased work of breathing Neurologic: Grossly intact Psychiatric: mood appropriate, affect full  Assessment: 44 y.o. [id number] presenting for blood pressure evaluation today  Plan: Problem List Items Addressed This Visit    None      Visit Diagnoses    Chronic hypertension with superimposed preeclampsia    -  Primary   Rectal fissure          1) Blood pressure - blood pressure at today's visit is mildly elevated.  As a result no adjustements to antihypertensive therapy made. - additional blood work was not obtained  2) Rectal fissure Rx emla cream, still crmaping rx norco.  Discussed stool softener like colace/docusate  3) Return in about 1 week (around 07/02/2019) for BP check.    Malachy Mood, MD, [facility], [facility] 06/25/2019, 2:21 PM

## 2019-07-05 ENCOUNTER — Other Ambulatory Visit: Payer: Self-pay

## 2019-07-05 ENCOUNTER — Ambulatory Visit (INDEPENDENT_AMBULATORY_CARE_PROVIDER_SITE_OTHER): Payer: Medicaid Other | Admitting: Obstetrics and Gynecology

## 2019-07-05 ENCOUNTER — Encounter: Payer: Self-pay | Admitting: Obstetrics and Gynecology

## 2019-07-05 VITALS — BP 150/104 | HR 82 | Wt 174.0 lb

## 2019-07-05 DIAGNOSIS — R519 Headache, unspecified: Secondary | ICD-10-CM | POA: Diagnosis not present

## 2019-07-05 DIAGNOSIS — Z0130 Encounter for examination of blood pressure without abnormal findings: Secondary | ICD-10-CM

## 2019-07-05 DIAGNOSIS — R252 Cramp and spasm: Secondary | ICD-10-CM | POA: Diagnosis not present

## 2019-07-05 MED ORDER — PROCHLORPERAZINE MALEATE 10 MG PO TABS: 10.0000 mg | ORAL_TABLET | Freq: Four times a day (QID) | ORAL | 3 refills | Status: AC | PRN
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
## 2019-07-05 NOTE — Progress Notes (Signed)
Headache and cramping really bad

## 2019-07-14 ENCOUNTER — Ambulatory Visit (INDEPENDENT_AMBULATORY_CARE_PROVIDER_SITE_OTHER): Payer: Medicaid Other | Admitting: Obstetrics and Gynecology

## 2019-07-14 ENCOUNTER — Other Ambulatory Visit: Payer: Self-pay

## 2019-07-14 ENCOUNTER — Encounter: Payer: Self-pay | Admitting: Obstetrics and Gynecology

## 2019-07-14 VITALS — BP 140/98 | Ht 63.0 in | Wt 165.0 lb

## 2019-07-14 DIAGNOSIS — O1043 Pre-existing secondary hypertension complicating the puerperium: Secondary | ICD-10-CM | POA: Diagnosis not present

## 2019-07-14 DIAGNOSIS — Z0131 Encounter for examination of blood pressure with abnormal findings: Secondary | ICD-10-CM

## 2019-07-14 DIAGNOSIS — Z0130 Encounter for examination of blood pressure without abnormal findings: Secondary | ICD-10-CM
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
## 2019-08-02 ENCOUNTER — Encounter: Payer: Self-pay | Admitting: Obstetrics and Gynecology

## 2019-08-02 ENCOUNTER — Ambulatory Visit (INDEPENDENT_AMBULATORY_CARE_PROVIDER_SITE_OTHER): Payer: Medicaid Other | Admitting: Obstetrics and Gynecology

## 2019-08-02 ENCOUNTER — Other Ambulatory Visit: Payer: Self-pay
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
## 2019-09-09 ENCOUNTER — Other Ambulatory Visit: Payer: Self-pay | Admitting: Obstetrics and Gynecology

## 2019-10-19 ENCOUNTER — Telehealth: Payer: Self-pay

## 2020-01-16 ENCOUNTER — Other Ambulatory Visit: Payer: Self-pay | Admitting: Certified Nurse Midwife

## 2020-03-07 ENCOUNTER — Other Ambulatory Visit: Payer: Self-pay | Admitting: Advanced Practice Midwife

## 2020-03-07 DIAGNOSIS — L309 Dermatitis, unspecified: Secondary | ICD-10-CM

## 2020-03-22 ENCOUNTER — Other Ambulatory Visit: Payer: Self-pay | Admitting: Obstetrics and Gynecology

## 2020-06-28 ENCOUNTER — Telehealth: Payer: Self-pay

## 2020-06-28 NOTE — Telephone Encounter (Signed)
Refill request recv'd from [facility] [address] for a refill on Nifedipine ER 90mg  tabletm one tablet by mouth qd #30 with 1refill. [id number]

## 2020-06-29 NOTE — Telephone Encounter (Signed)
Left detailed msg on pharm vm.

## 2020-06-29 NOTE — Telephone Encounter (Signed)
Needs to go through PCP

## 2021-02-24 ENCOUNTER — Emergency Department
Admission: EM | Admit: 2021-02-24 | Discharge: 2021-02-24 | Disposition: A | Discharge status: Home / Self Care | Payer: Medicaid Other | Attending: Emergency Medicine | Admitting: Emergency Medicine

## 2021-02-24 ENCOUNTER — Emergency Department: Payer: Medicaid Other

## 2021-02-24 ENCOUNTER — Other Ambulatory Visit: Payer: Self-pay

## 2021-02-24 DIAGNOSIS — I10 Essential (primary) hypertension: Secondary | ICD-10-CM | POA: Diagnosis not present

## 2021-02-24 DIAGNOSIS — M25551 Pain in right hip: Secondary | ICD-10-CM

## 2021-02-24 DIAGNOSIS — M25571 Pain in right ankle and joints of right foot: Secondary | ICD-10-CM

## 2021-02-24 DIAGNOSIS — M542 Cervicalgia: Secondary | ICD-10-CM

## 2021-02-24 DIAGNOSIS — Z87891 Personal history of nicotine dependence: Secondary | ICD-10-CM | POA: Diagnosis not present

## 2021-02-24 DIAGNOSIS — S161XXA Strain of muscle, fascia and tendon at neck level, initial encounter: Secondary | ICD-10-CM | POA: Insufficient documentation

## 2021-02-24 DIAGNOSIS — M545 Low back pain, unspecified: Secondary | ICD-10-CM | POA: Insufficient documentation

## 2021-02-24 DIAGNOSIS — S199XXA Unspecified injury of neck, initial encounter: Secondary | ICD-10-CM | POA: Diagnosis present

## 2021-02-24 DIAGNOSIS — R519 Headache, unspecified: Secondary | ICD-10-CM | POA: Insufficient documentation

## 2021-02-24 DIAGNOSIS — M25561 Pain in right knee: Secondary | ICD-10-CM

## 2021-02-24 DIAGNOSIS — Y92410 Unspecified street and highway as the place of occurrence of the external cause: Secondary | ICD-10-CM | POA: Insufficient documentation

## 2021-02-24 DIAGNOSIS — T148XXA Other injury of unspecified body region, initial encounter: Secondary | ICD-10-CM

## 2021-02-24 DIAGNOSIS — M546 Pain in thoracic spine: Secondary | ICD-10-CM | POA: Diagnosis not present

## 2021-02-24 LAB — POC URINE PREG, ED: Preg Test, Ur: NEGATIVE

## 2021-02-24 MED ORDER — LIDOCAINE 5 % EX PTCH
2.0000 | MEDICATED_PATCH | CUTANEOUS | Status: DC
  Administered 2021-02-24: 2 via TRANSDERMAL
  Filled 2021-02-24: qty 2

## 2021-02-24 MED ORDER — IBUPROFEN 400 MG PO TABS
400.0000 mg | ORAL_TABLET | Freq: Once | ORAL | Status: AC
  Administered 2021-02-24: 400 mg via ORAL
  Filled 2021-02-24: qty 1

## 2021-02-24 MED ORDER — NIFEDIPINE ER OSMOTIC RELEASE 30 MG PO TB24
90.0000 mg | ORAL_TABLET | Freq: Every day | ORAL | Status: DC
  Administered 2021-02-24: 90 mg via ORAL
  Filled 2021-02-24 (×2): qty 1
  Filled 2021-02-24: qty 3

## 2021-02-24 MED ORDER — IBUPROFEN 200 MG PO TABS: 400.0000 mg | ORAL_TABLET | Freq: Four times a day (QID) | ORAL | 0 refills | Status: AC | PRN

## 2021-02-24 NOTE — Discharge Instructions (Addendum)
Please have your blood pressure rechecked in 2 or 3 days by your primary care doctor to ensure it is coming back to a normal range.  In the meantime you may take 1 g Tylenol and 400 mg ibuprofen every 6 hours for pain and should return immediately to the emergency room if you experience any new or worsening of pain.
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
## 2021-02-24 NOTE — ED Notes (Signed)
Pt dc ppw and work not provided Pt informed of rx sent to home pharm. Pt assisted off unit to WR via wheelchair

## 2021-09-28 IMAGING — DX DG FOOT COMPLETE 3+V*L*
3 series · 3 of 3 positions shown · non-contrast
Comparison: None.

CLINICAL DATA: Fall down steps.  Left foot and ankle pain

EXAM:
LEFT FOOT - COMPLETE 3+ VIEW

[foot ap]
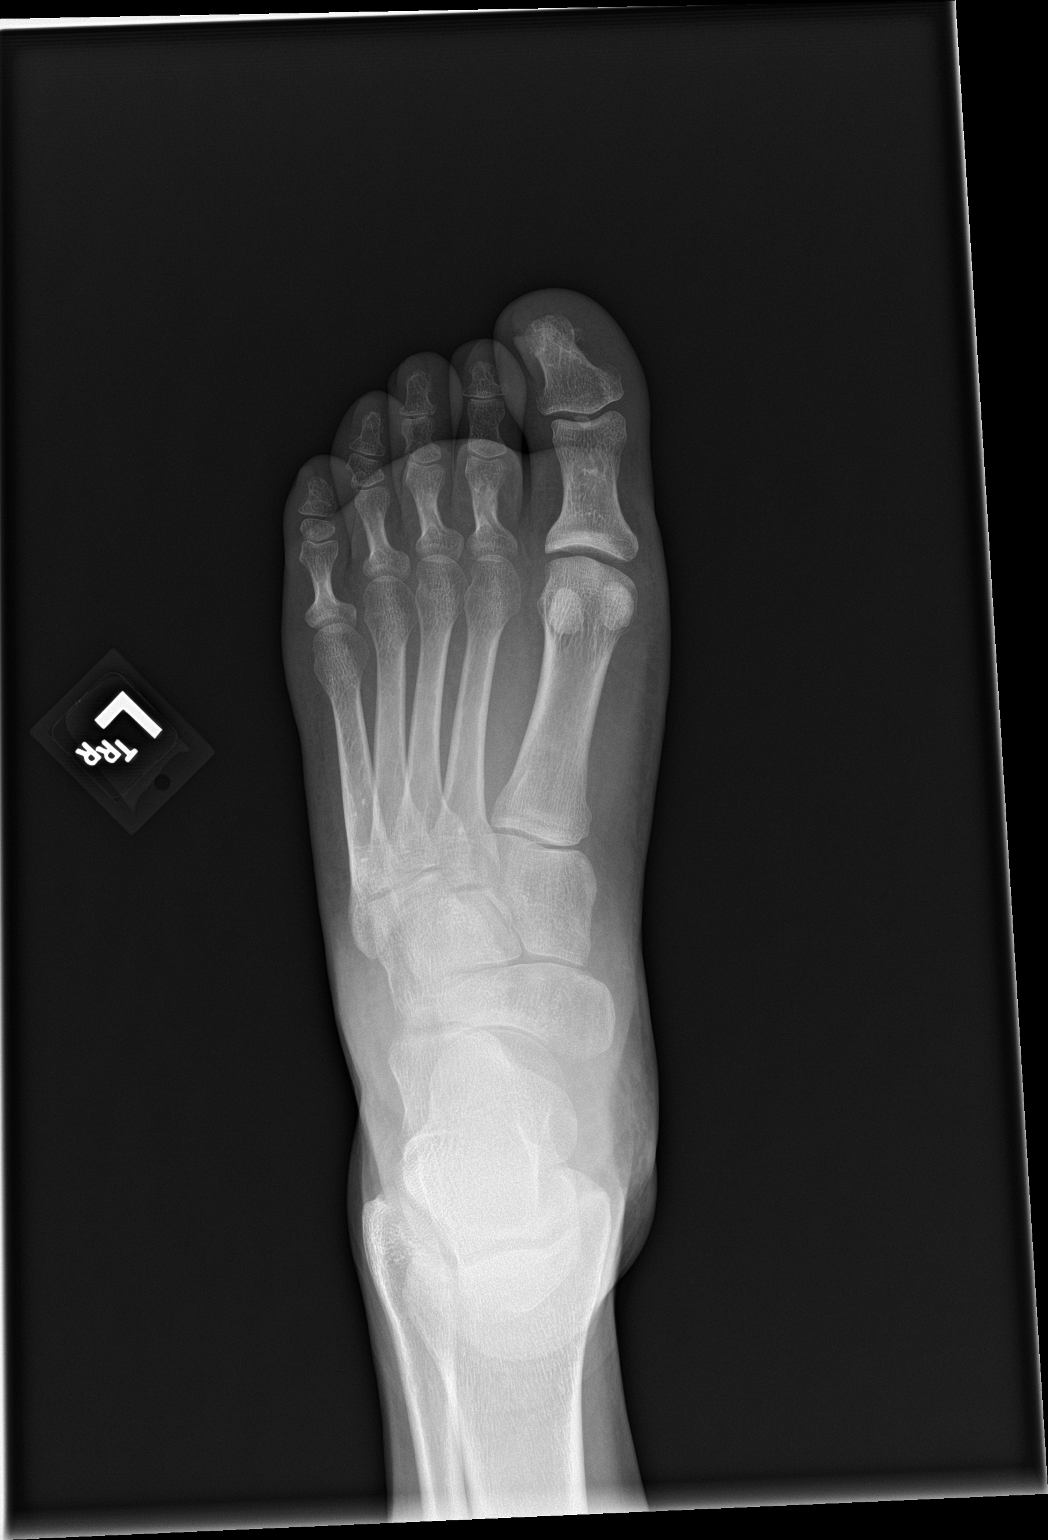

[foot obl]
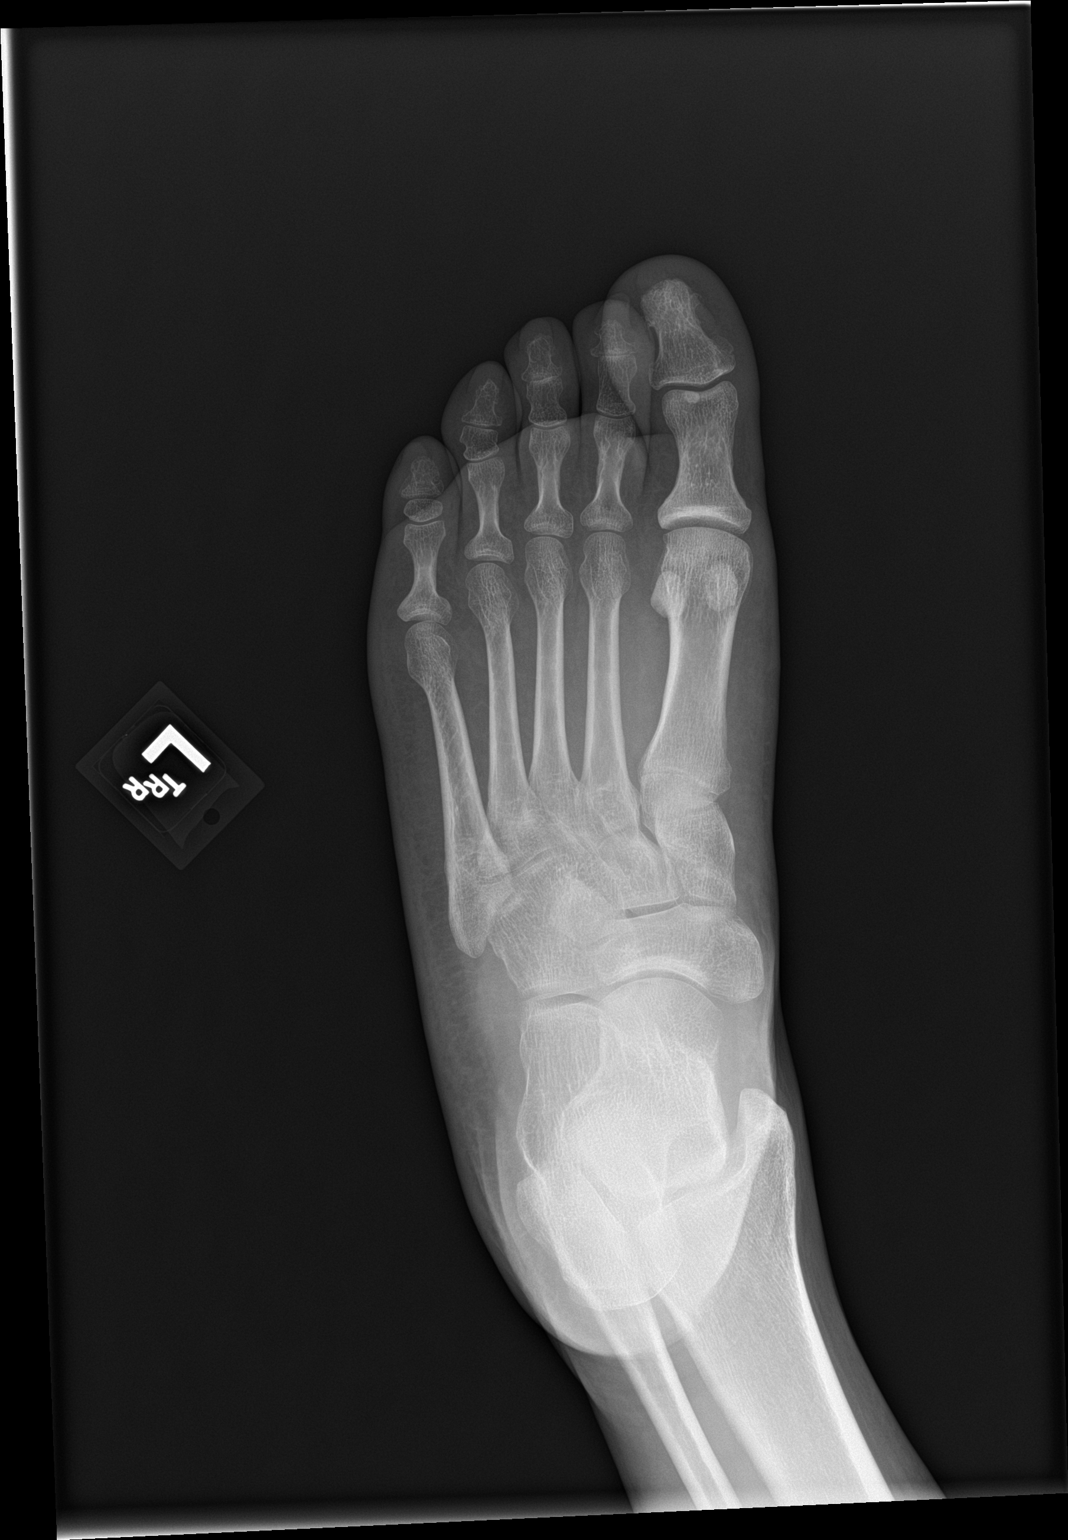

[foot lat]
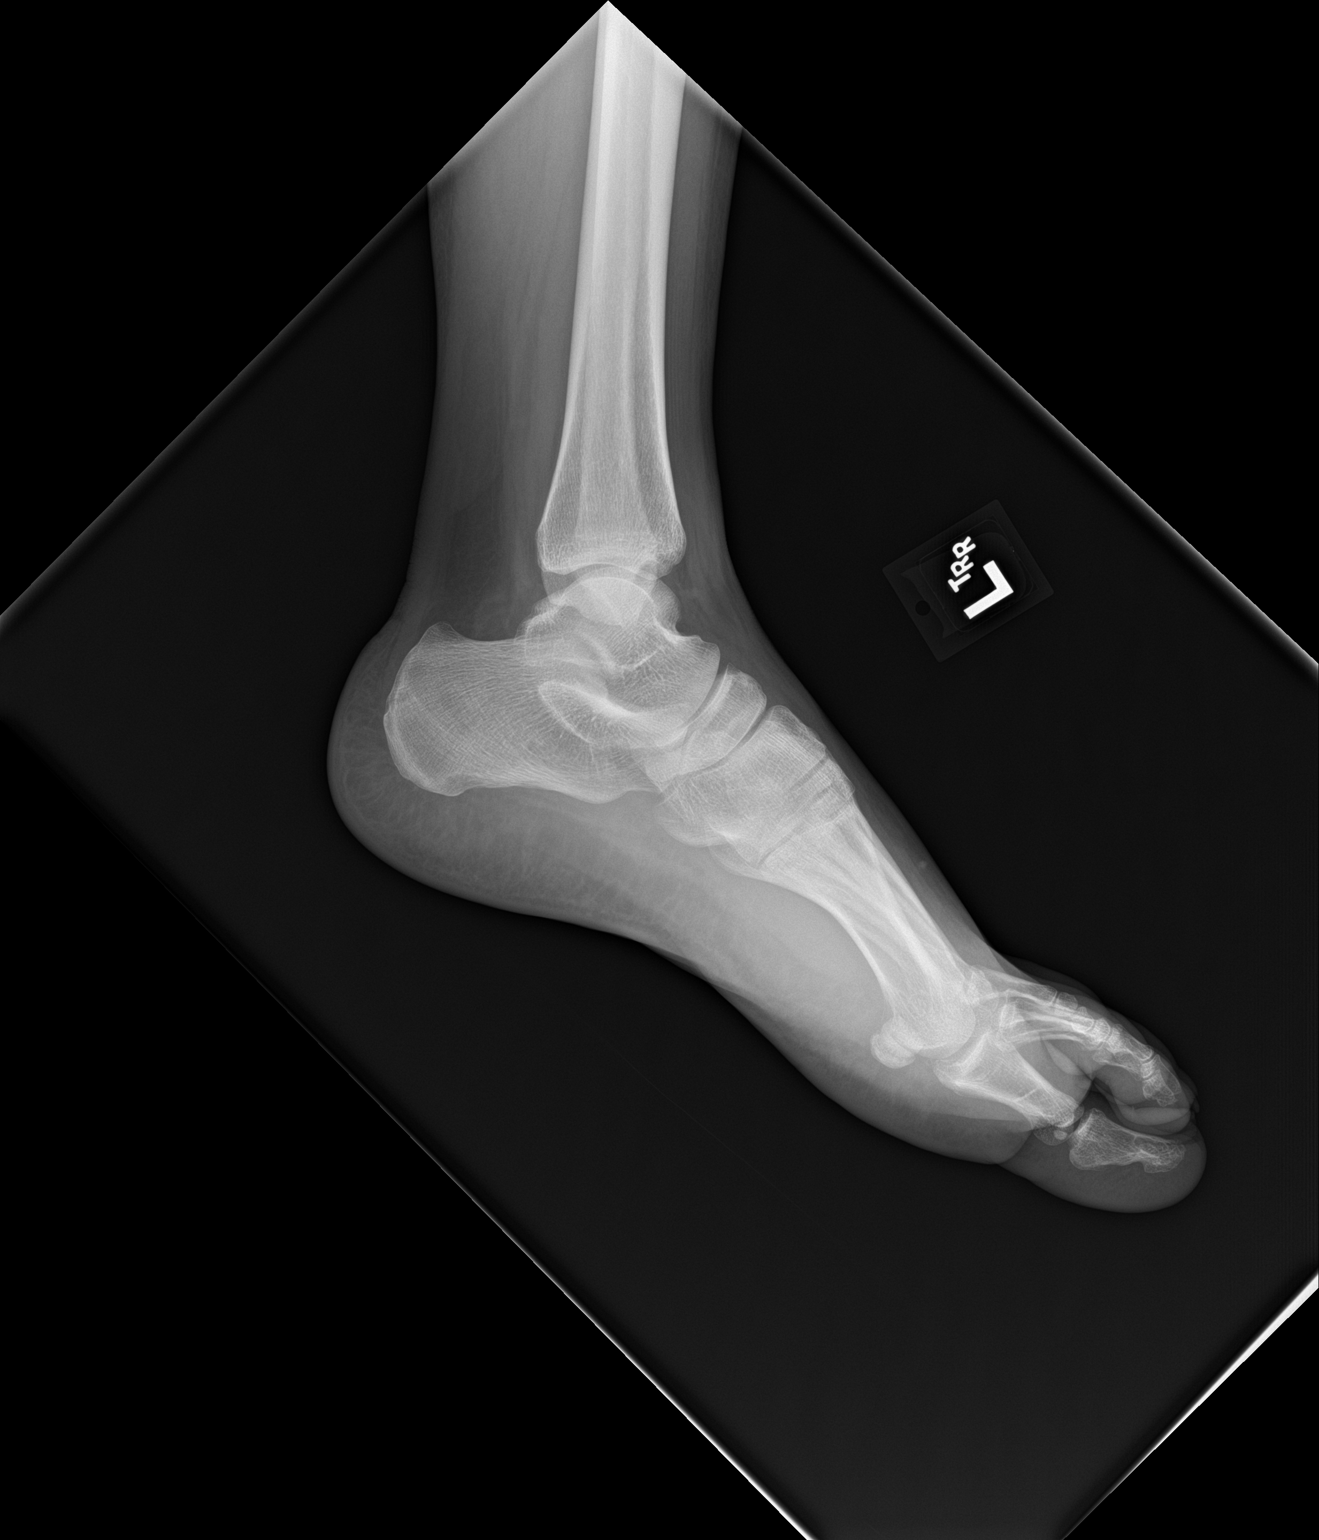

[3 of 3 positions shown; findings below may reference images not displayed]

FINDINGS: There is no evidence of fracture or dislocation. There is no
evidence of arthropathy or other focal bone abnormality. Soft
tissues are unremarkable.
IMPRESSION: Negative.

## 2021-09-28 IMAGING — DX DG ANKLE COMPLETE 3+V*L*
3 series · 3 of 3 positions shown · non-contrast
Comparison: None.

CLINICAL DATA: Fall.  Left foot and ankle pain

EXAM:
LEFT ANKLE COMPLETE - 3+ VIEW

[ankle ap]
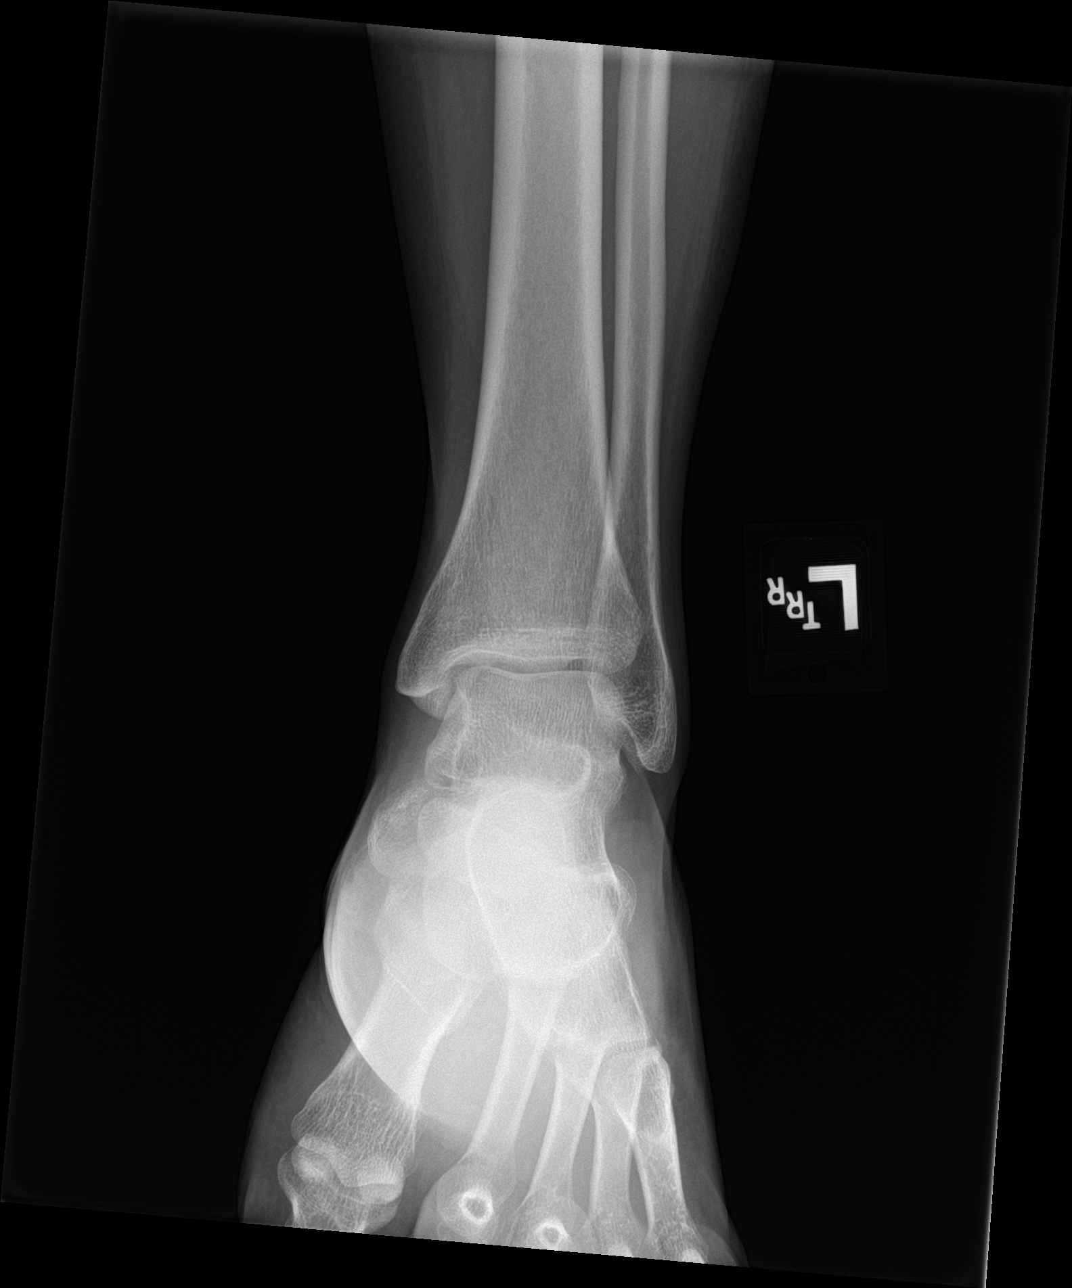

[ankle obl]
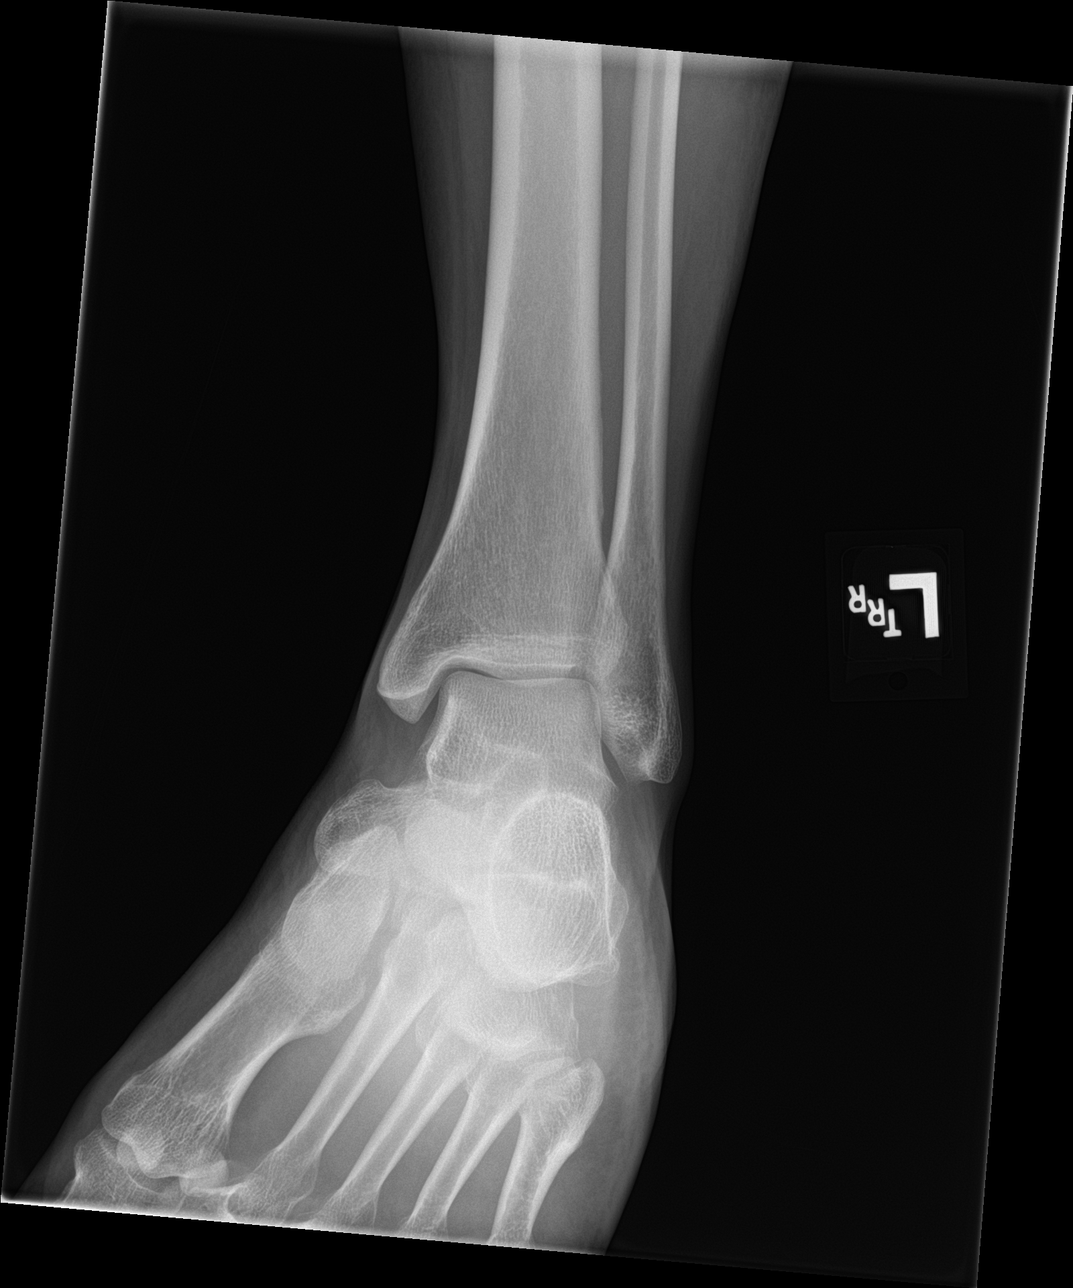

[ankle lat]
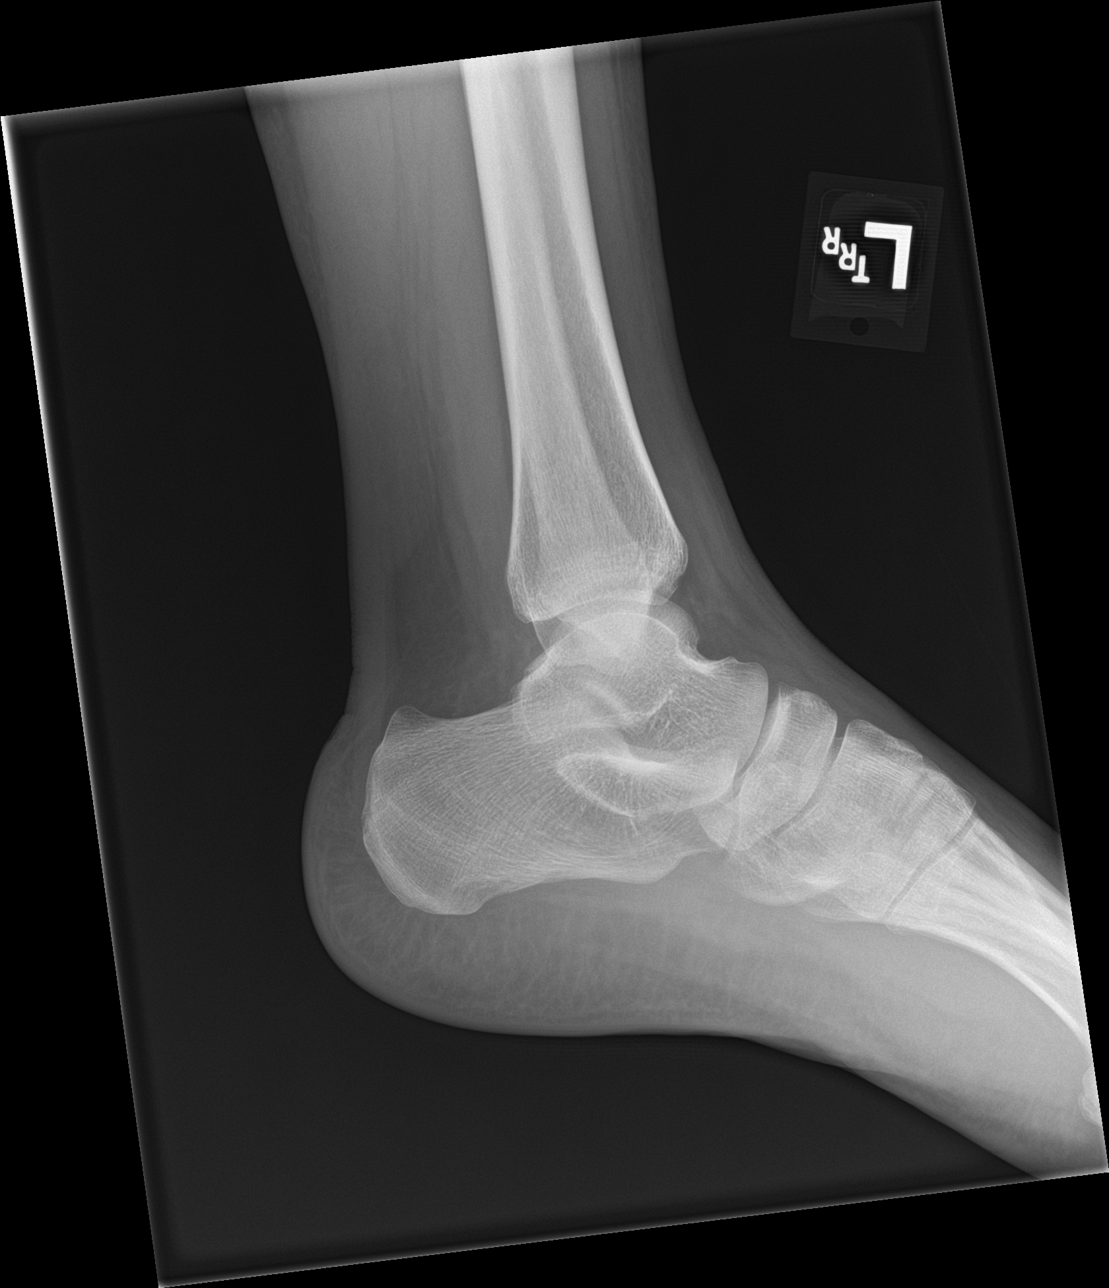

[3 of 3 positions shown; findings below may reference images not displayed]

FINDINGS: There is no evidence of fracture, dislocation, or joint effusion.
There is no evidence of arthropathy or other focal bone abnormality.
Soft tissues are unremarkable.
IMPRESSION: Negative.

## 2022-09-18 NOTE — Telephone Encounter (Signed)
Error

## 2023-09-27 ENCOUNTER — Emergency Department
Admission: EM | Admit: 2023-09-27 | Discharge: 2023-09-27 | Disposition: A | Discharge status: Home / Self Care | Payer: Self-pay

## 2023-09-27 ENCOUNTER — Emergency Department: Payer: Self-pay

## 2023-09-27 DIAGNOSIS — M5442 Lumbago with sciatica, left side: Secondary | ICD-10-CM | POA: Diagnosis not present

## 2023-09-27 DIAGNOSIS — M5441 Lumbago with sciatica, right side: Secondary | ICD-10-CM | POA: Insufficient documentation

## 2023-09-27 DIAGNOSIS — I10 Essential (primary) hypertension: Secondary | ICD-10-CM | POA: Insufficient documentation

## 2023-09-27 DIAGNOSIS — W182XXA Fall in (into) shower or empty bathtub, initial encounter: Secondary | ICD-10-CM | POA: Diagnosis not present

## 2023-09-27 DIAGNOSIS — M545 Low back pain, unspecified: Secondary | ICD-10-CM | POA: Diagnosis present

## 2023-09-27 LAB — CBC WITH DIFFERENTIAL/PLATELET
Abs Immature Granulocytes: 0.06 10*3/uL (ref 0.00–0.07)
Basophils Absolute: 0.1 10*3/uL (ref 0.0–0.1)
Basophils Relative: 0 %
Eosinophils Absolute: 0.3 10*3/uL (ref 0.0–0.5)
Eosinophils Relative: 2 %
HCT: 36.7 % (ref 36.0–46.0)
Hemoglobin: 12.7 g/dL (ref 12.0–15.0)
Immature Granulocytes: 0 %
Lymphocytes Relative: 28 %
Lymphs Abs: 3.8 10*3/uL (ref 0.7–4.0)
MCH: 30.1 pg (ref 26.0–34.0)
MCHC: 34.6 g/dL (ref 30.0–36.0)
MCV: 87 fL (ref 80.0–100.0)
Monocytes Absolute: 0.9 10*3/uL (ref 0.1–1.0)
Monocytes Relative: 7 %
Neutro Abs: 8.7 10*3/uL — ABNORMAL HIGH (ref 1.7–7.7)
Neutrophils Relative %: 63 %
Platelets: 419 10*3/uL — ABNORMAL HIGH (ref 150–400)
RBC: 4.22 MIL/uL (ref 3.87–5.11)
RDW: 13.5 % (ref 11.5–15.5)
WBC: 13.7 10*3/uL — ABNORMAL HIGH (ref 4.0–10.5)
nRBC: 0 % (ref 0.0–0.2)

## 2023-09-27 LAB — COMPREHENSIVE METABOLIC PANEL WITH GFR
ALT: 31 U/L (ref 0–44)
AST: 26 U/L (ref 15–41)
Albumin: 4.4 g/dL (ref 3.5–5.0)
Alkaline Phosphatase: 65 U/L (ref 38–126)
Anion gap: 10 (ref 5–15)
BUN: 11 mg/dL (ref 6–20)
CO2: 21 mmol/L — ABNORMAL LOW (ref 22–32)
Calcium: 8.9 mg/dL (ref 8.9–10.3)
Chloride: 107 mmol/L (ref 98–111)
Creatinine, Ser: 0.77 mg/dL (ref 0.44–1.00)
GFR, Estimated: >60 mL/min (ref >60)
Glucose, Bld: 104 mg/dL — ABNORMAL HIGH (ref 70–99)
Potassium: 3.1 mmol/L — ABNORMAL LOW (ref 3.5–5.1)
Sodium: 138 mmol/L (ref 135–145)
Total Bilirubin: 0.6 mg/dL (ref 0.0–1.2)
Total Protein: 7.8 g/dL (ref 6.5–8.1)

## 2023-09-27 LAB — URINALYSIS, ROUTINE W REFLEX MICROSCOPIC
Bacteria, UA: NONE SEEN
Bilirubin Urine: NEGATIVE
Glucose, UA: NEGATIVE mg/dL
Hgb urine dipstick: NEGATIVE
Ketones, ur: NEGATIVE mg/dL
Nitrite: NEGATIVE
Protein, ur: NEGATIVE mg/dL
Specific Gravity, Urine: 1.017 (ref 1.005–1.030)
pH: 6 (ref 5.0–8.0)

## 2023-09-27 LAB — PREGNANCY, URINE: Preg Test, Ur: NEGATIVE

## 2023-09-27 MED ORDER — ACETAMINOPHEN 500 MG PO TABS
1000.0000 mg | ORAL_TABLET | Freq: Once | ORAL | Status: AC
  Administered 2023-09-27: 1000 mg via ORAL
  Filled 2023-09-27: qty 2

## 2023-09-27 MED ORDER — LIDOCAINE 5 % EX PTCH
1.0000 | MEDICATED_PATCH | CUTANEOUS | Status: DC
  Administered 2023-09-27: 1 via TRANSDERMAL
  Filled 2023-09-27: qty 1

## 2023-09-27 MED ORDER — GABAPENTIN 100 MG PO CAPS
100.0000 mg | ORAL_CAPSULE | Freq: Once | ORAL | Status: AC
Start: 2023-09-27 — End: 2023-09-27
  Administered 2023-09-27: 100 mg via ORAL
  Filled 2023-09-27: qty 1

## 2023-09-27 MED ORDER — AMLODIPINE BESYLATE 5 MG PO TABS: 5.0000 mg | ORAL_TABLET | Freq: Every day | ORAL | 2 refills | Status: AC

## 2023-09-27 MED ORDER — POTASSIUM CHLORIDE CRYS ER 20 MEQ PO TBCR
40.0000 meq | EXTENDED_RELEASE_TABLET | Freq: Once | ORAL | Status: AC
  Administered 2023-09-27: 40 meq via ORAL
  Filled 2023-09-27: qty 2

## 2023-09-27 MED ORDER — IOHEXOL 350 MG/ML SOLN
100.0000 mL | Freq: Once | INTRAVENOUS | Status: AC | PRN
  Administered 2023-09-27: 100 mL via INTRAVENOUS

## 2023-09-27 MED ORDER — KETOROLAC TROMETHAMINE 15 MG/ML IJ SOLN
15.0000 mg | Freq: Once | INTRAMUSCULAR | Status: AC
  Administered 2023-09-27: 15 mg via INTRAMUSCULAR
  Filled 2023-09-27: qty 1

## 2023-09-27 MED ORDER — MELOXICAM 15 MG PO TABS: 15.0000 mg | ORAL_TABLET | Freq: Every day | ORAL | 0 refills | Status: AC

## 2023-09-27 MED ORDER — DEXAMETHASONE SODIUM PHOSPHATE 10 MG/ML IJ SOLN
10.0000 mg | Freq: Once | INTRAMUSCULAR | Status: AC
  Administered 2023-09-27: 10 mg via INTRAVENOUS
  Filled 2023-09-27: qty 1

## 2023-09-27 MED ORDER — MORPHINE SULFATE (PF) 4 MG/ML IV SOLN
4.0000 mg | Freq: Once | INTRAVENOUS | Status: AC
  Administered 2023-09-27: 4 mg via INTRAVENOUS
  Filled 2023-09-27: qty 1

## 2023-09-27 MED ORDER — GABAPENTIN 100 MG PO CAPS
100.0000 mg | ORAL_CAPSULE | Freq: Three times a day (TID) | ORAL | 2 refills | Status: AC
Start: 2023-09-27 — End: 2024-09-26

## 2023-09-27 MED ORDER — ATENOLOL 25 MG PO TABS
25.0000 mg | ORAL_TABLET | Freq: Once | ORAL | Status: AC
  Administered 2023-09-27: 25 mg via ORAL
  Filled 2023-09-27: qty 1

## 2023-09-27 MED ORDER — LIDOCAINE 5 % EX PTCH: 1.0000 | MEDICATED_PATCH | CUTANEOUS | 0 refills | Status: AC

## 2023-09-27 NOTE — Discharge Instructions (Addendum)
 Please begin taking the gabapentin 3 times a day. This is to treat nerve pain.   Please take the meloxicam (Mobic) once a day for 2 weeks.  This is an anti-inflammatory.  Do not take other NSAIDs while taking this medication.  NSAIDs include ibuprofen , Motrin , Advil , naproxen, Aleve, celecoxib, and Celebrex.  You can take 650 mg of Tylenol  every 6 hours as needed for pain. You can use ice, heat, muscle creams and other topical pain relievers as well.  Please check your BP a few times a week. Take the amlodipine once a day. Follow up with your PCP for a blood pressure re-check.  Your CT scan is reassuring.  There is no evidence of acute injury or illness.

## 2023-09-27 NOTE — ED Triage Notes (Signed)
 Patient states low back pain that radiates into left hip; denies injury and/or trauma.

## 2023-09-27 NOTE — ED Provider Notes (Signed)
 [facility] Provider Note    Event Date/Time   First MD Initiated Contact with Patient 09/27/23 1508     (approximate)   History   Back Pain   HPI  Kelly Huber is a 48 y.o. female with PMH of chronic hypertension presents for evaluation of lower back pain.  Patient states a week ago she slipped in the shower and caught herself using the shower bar but landed halfway out of the bathtub.  She did not feel any pain at that time but the next morning had significant back pain.  She states she has been laid up in bed all week due to her pain.  She has tried taking a muscle relaxer, Tylenol  and ibuprofen  as well as using ice and heat at home.  Denies saddle anesthesia, bladder or bowel incontinence, fevers and history of IV drug use.      Physical Exam   Triage Vital Signs: ED Triage Vitals  Encounter Vitals Group     BP 09/27/23 1456 (!) 212/123     Systolic BP Percentile --      Diastolic BP Percentile --      Pulse Rate 09/27/23 1456 96     Resp 09/27/23 1456 20     Temp 09/27/23 1456 (!) 97.5 F (36.4 C)     Temp Source 09/27/23 1456 Oral     SpO2 09/27/23 1456 100 %     Weight 09/27/23 1455 158 lb (71.7 kg)     Height 09/27/23 1455 5\' 4"  (1.626 m)     Head Circumference --      Peak Flow --      Pain Score 09/27/23 1456 10     Pain Loc --      Pain Education --      Exclude from Growth Chart --     Most recent vital signs: Vitals:   09/27/23 1739 09/27/23 1901  BP: (!) 214/121 (!) 212/135  Pulse: 65   Resp: 18   Temp: 97.9 F (36.6 C)   SpO2: 100%    General: Awake, no distress.  CV:  Good peripheral perfusion. RRR. Resp:  Normal effort. CTAB. Abd:  No distention.  Other:  TTP in the lumbar spine and left paraspinal muscles, positive straight leg raise bilaterally, sensation maintained in distal lower extremities, dorsalis pedis pulse is 2+ bilaterally, strength is 5/5 bilaterally, no overlying skin changes, erythema, or bruising  to the lower back   ED Results / Procedures / Treatments   Labs (all labs ordered are listed, but only abnormal results are displayed) Labs Reviewed  URINALYSIS, ROUTINE W REFLEX MICROSCOPIC - Abnormal; Notable for the following components:      Result Value   Color, Urine YELLOW (*)    APPearance CLEAR (*)    Leukocytes,Ua TRACE (*)    All other components within normal limits  COMPREHENSIVE METABOLIC PANEL WITH GFR - Abnormal; Notable for the following components:   Potassium 3.1 (*)    CO2 21 (*)    Glucose, Bld 104 (*)    All other components within normal limits  CBC WITH DIFFERENTIAL/PLATELET - Abnormal; Notable for the following components:   WBC 13.7 (*)    Platelets 419 (*)    Neutro Abs 8.7 (*)    All other components within normal limits  PREGNANCY, URINE    RADIOLOGY  Lumbar spine xray obtained, I interpreted the images as well as reviewed the radiologist report which was negative for acute  abnormalities.   PROCEDURES:  Critical Care performed: No  Procedures   MEDICATIONS ORDERED IN ED: Medications  lidocaine  (LIDODERM ) 5 % 1 patch (1 patch Transdermal Patch Applied 09/27/23 1620)  dexamethasone (DECADRON) injection 10 mg (has no administration in time range)  ketorolac  (TORADOL ) 15 MG/ML injection 15 mg (15 mg Intramuscular Given 09/27/23 1620)  morphine  (PF) 4 MG/ML injection 4 mg (4 mg Intravenous Given 09/27/23 1652)  acetaminophen  (TYLENOL ) tablet 1,000 mg (1,000 mg Oral Given 09/27/23 1651)  atenolol (TENORMIN) tablet 25 mg (25 mg Oral Given 09/27/23 1651)  gabapentin (NEURONTIN) capsule 100 mg (100 mg Oral Given 09/27/23 1744)  potassium chloride SA (KLOR-CON M) CR tablet 40 mEq (40 mEq Oral Given 09/27/23 1808)  iohexol (OMNIPAQUE) 350 MG/ML injection 100 mL (100 mLs Intravenous Contrast Given 09/27/23 1818)  morphine  (PF) 4 MG/ML injection 4 mg (4 mg Intravenous Given 09/27/23 1844)     IMPRESSION / MDM / ASSESSMENT AND PLAN / ED COURSE  I reviewed  the triage vital signs and the nursing notes.                             48 year old female presents for evaluation of lower back pain.  Patient is hypertensive on presentation otherwise vital signs are stable.  Patient in significant pain on exam.  Differential diagnosis includes, but is not limited to, lumbar strain, sciatica, lumbar fracture, UTI, hematoma, contusion  Patient's presentation is most consistent with acute complicated illness / injury requiring diagnostic workup.  Patient's blood pressure is quite elevated but she denies symptoms related to this like chest pain and SOB, will treat her pain and give her a dose of her home medication that she missed and reassess.   Patient is tender over her flanks we will check a UA to rule out UTI and pyelonephritis.  Suspect lumbar strain/sciatica as the cause of patient's pain, but given her persistently elevated BP and significant flank pain, will evaluated for renal injury.  CT scans are pending. Care of the patient will be passed off to the oncoming provider who will make final diagnosis and plan. If negative, will treat musculoskeletal pain with multi-modal pain management using gabapentin, anti-inflammatories, tylenol  and topical pain relievers. Advised patient to follow up with orthopedics. She voiced understanding, all questions were answered and she was stable at the time of hand off.  Clinical Course as of 09/27/23 1913  Sat Sep 27, 2023  1756 Urinalysis, Routine w reflex microscopic -Urine, Clean Catch(!) UA shows trace leukocytes but is otherwise unremarkable.  [LD]  1757 Patient's hypertension is persistent despite administration of home meds and treating her pain. Will obtain some basic labs and get CT scans. I am concerned about a renal injury given her history of trauma, tenderness on the left flank and hypertension. [LD]  1800 Comprehensive metabolic panel(!) Mild hypokalemia, will give oral potassium. [LD]  1802 DG Lumbar  Spine Complete Negative. [LD]  1836 Patient requested more pain medication after CT scans. [LD]  1912 CBC with Differential(!) Mild leukocytosis, otherwise unremarkable. [LD]    Clinical Course User Index [LD] Phyliss Breen, PA-C     FINAL CLINICAL IMPRESSION(S) / ED DIAGNOSES   Final diagnoses:  Acute left-sided low back pain with bilateral sciatica  Asymptomatic hypertension     Rx / DC Orders   ED Discharge Orders          Ordered    gabapentin (NEURONTIN) 100  MG capsule  3 times daily        09/27/23 1843    meloxicam (MOBIC) 15 MG tablet  Daily        09/27/23 1844    lidocaine  (LIDODERM ) 5 %  Every 24 hours        09/27/23 1902    amLODipine (NORVASC) 5 MG tablet  Daily        09/27/23 1904             Note:  This document was prepared using Dragon voice recognition software and may include unintentional dictation errors.   Phyliss Breen, PA-C 09/27/23 1913    Collis Deaner, MD 09/28/23 1106

## 2023-09-27 NOTE — ED Provider Notes (Signed)
-----------------------------------------   7:18 PM on 09/27/2023 -----------------------------------------  Blood pressure (!) 212/135, pulse 65, temperature 97.9 F (36.6 C), temperature source Oral, resp. rate 18, height 5\' 4"  (1.626 m), weight 71.7 kg, SpO2 100%, currently breastfeeding.  Assuming care from [place], PA-C.  In short, Kelly Huber is a 48 y.o. female with a chief complaint of Back Pain .  Refer to the original H&P for additional details.  The current plan of care is to awaiting CT scan.  ----------------------------------------- 9:43 PM on 09/27/2023 ----------------------------------------- CT scan results updated to patient. Reassuring. Will provide lidocaine  patch prior to discharge.  Advised to follow-up with primary care provider for further evaluation.   Clinical Course as of 09/27/23 2144  Sat Sep 27, 2023  1756 Urinalysis, Routine w reflex microscopic -Urine, Clean Catch(!) UA shows trace leukocytes but is otherwise unremarkable.  [LD]  1757 Patient's hypertension is persistent despite administration of home meds and treating her pain. Will obtain some basic labs and get CT scans. I am concerned about a renal injury given her history of trauma, tenderness on the left flank and hypertension. [LD]  1800 Comprehensive metabolic panel(!) Mild hypokalemia, will give oral potassium. [LD]  1802 DG Lumbar Spine Complete Negative. [LD]  1836 Patient requested more pain medication after CT scans. [LD]  1912 CBC with Differential(!) Mild leukocytosis, otherwise unremarkable. [LD]  2143 CT Angio Abd/Pel W and/or Wo Contrast Unremarkable [MH]    Clinical Course User Index [LD] Phyliss Breen, PA-C [MH] Billye Buerger, PA-C      [place], Dartanyon Frankowski A, PA-C 09/27/23 2144    Collis Deaner, MD 09/28/23 Larita Pluck
